# Patient Record
Sex: Female | Born: 1970
Health system: Southern US, Community
[De-identification: ages and names within clinical notes are randomized; demographics above are authoritative.]

## PROBLEM LIST (undated history)

## (undated) DIAGNOSIS — E669 Obesity, unspecified: Secondary | ICD-10-CM

## (undated) DIAGNOSIS — I10 Essential (primary) hypertension: Secondary | ICD-10-CM

## (undated) DIAGNOSIS — E782 Mixed hyperlipidemia: Secondary | ICD-10-CM

## (undated) DIAGNOSIS — K439 Ventral hernia without obstruction or gangrene: Secondary | ICD-10-CM

## (undated) DIAGNOSIS — J45909 Unspecified asthma, uncomplicated: Secondary | ICD-10-CM

## (undated) DIAGNOSIS — M1990 Unspecified osteoarthritis, unspecified site: Secondary | ICD-10-CM

## (undated) DIAGNOSIS — T7840XA Allergy, unspecified, initial encounter: Secondary | ICD-10-CM

## (undated) DIAGNOSIS — E119 Type 2 diabetes mellitus without complications: Secondary | ICD-10-CM

## (undated) DIAGNOSIS — J3089 Other allergic rhinitis: Secondary | ICD-10-CM

## (undated) HISTORY — PX: FOOT SURGERY: SHX648

## (undated) HISTORY — DX: Unspecified asthma, uncomplicated: J45.909

## (undated) HISTORY — DX: Allergy, unspecified, initial encounter: T78.40XA

## (undated) HISTORY — DX: Unspecified osteoarthritis, unspecified site: M19.90

## (undated) HISTORY — DX: Type 2 diabetes mellitus without complications: E11.9

## (undated) HISTORY — DX: Essential (primary) hypertension: I10

## (undated) HISTORY — PX: DILATION AND CURETTAGE OF UTERUS: SHX78

## (undated) HISTORY — DX: Other allergic rhinitis: J30.89

## (undated) HISTORY — PX: HERNIA REPAIR: SHX51

## (undated) HISTORY — PX: TUBAL LIGATION: SHX77

---

## 1991-07-07 HISTORY — PX: CHOLECYSTECTOMY: SHX55

## 2021-01-13 DIAGNOSIS — M1712 Unilateral primary osteoarthritis, left knee: Secondary | ICD-10-CM | POA: Diagnosis not present

## 2021-02-19 LAB — HM DIABETES EYE EXAM

## 2021-02-21 ENCOUNTER — Ambulatory Visit: Payer: Federal, State, Local not specified - PPO | Admitting: Podiatry

## 2021-02-21 ENCOUNTER — Ambulatory Visit (INDEPENDENT_AMBULATORY_CARE_PROVIDER_SITE_OTHER): Payer: Federal, State, Local not specified - PPO

## 2021-02-21 ENCOUNTER — Other Ambulatory Visit: Payer: Self-pay

## 2021-02-21 DIAGNOSIS — E0843 Diabetes mellitus due to underlying condition with diabetic autonomic (poly)neuropathy: Secondary | ICD-10-CM

## 2021-02-21 DIAGNOSIS — M79675 Pain in left toe(s): Secondary | ICD-10-CM

## 2021-02-21 DIAGNOSIS — M79674 Pain in right toe(s): Secondary | ICD-10-CM

## 2021-02-21 DIAGNOSIS — M722 Plantar fascial fibromatosis: Secondary | ICD-10-CM

## 2021-02-21 DIAGNOSIS — B351 Tinea unguium: Secondary | ICD-10-CM | POA: Diagnosis not present

## 2021-02-21 MED ORDER — BETAMETHASONE SOD PHOS & ACET 6 (3-3) MG/ML IJ SUSP
3.0000 mg | Freq: Once | INTRAMUSCULAR | Status: AC
  Administered 2021-02-21: 3 mg via INTRA_ARTICULAR

## 2021-02-21 NOTE — Progress Notes (Signed)
   SUBJECTIVE Patient with a history of diabetes mellitus presents to office today complaining of elongated, thickened nails that cause pain while ambulating in shoes.  Patient is unable to trim their own nails.  Patient also presents for symptomatic painful plantar fibromas to the bilateral feet.  She states that she has a longstanding history of plantar fibromas.  She actually had a plantar fibroma surgery to the left foot in 2014 in [place].  She has seen multiple physicians and discussed both conservative and surgical management for the lesions.  Today she is not interested in the surgery but she would like to address some conservative treatment options.  Patient is here for further evaluation and treatment.   No past medical history on file.  OBJECTIVE General Patient is awake, alert, and oriented x 3 and in no acute distress. Derm Skin is dry and supple bilateral. Negative open lesions or macerations. Remaining integument unremarkable. Nails are tender, long, thickened and dystrophic with subungual debris, consistent with onychomycosis, 1-5 bilateral. No signs of infection noted. Vasc  DP and PT pedal pulses palpable bilaterally. Temperature gradient within normal limits.  Neuro Epicritic and protective threshold sensation diminished bilaterally.  Musculoskeletal Exam No symptomatic pedal deformities noted bilateral. Muscular strength within normal limits. Symptomatic palpable plantar fibromas noted along the medial longitudinal arch of the bilateral feet.  ASSESSMENT 1. Diabetes Mellitus w/ peripheral neuropathy 2.  Pain due to onychomycosis of toenails bilateral 3. Plantar fibromas bilateral 4. H/o plantar fibroma excision LT foot 2014  PLAN OF CARE 1. Patient evaluated today. 2. Instructed to maintain good pedal hygiene and foot care. Stressed importance of controlling blood sugar.  3. Mechanical debridement of nails 1-5 bilaterally performed using a nail nipper. Filed with  dremel without incident.  4.  Injection of 0.5 cc Celestone Soluspan injected into the bilateral fibroma lesions  5.  Return to clinic in 3 mos.   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 Edrick Kins, DPM    [address], [place] [address]                Office [phone]  Fax [phone]

## 2021-03-18 ENCOUNTER — Ambulatory Visit (INDEPENDENT_AMBULATORY_CARE_PROVIDER_SITE_OTHER): Payer: Federal, State, Local not specified - PPO | Admitting: Internal Medicine

## 2021-03-18 ENCOUNTER — Other Ambulatory Visit: Payer: Self-pay

## 2021-03-18 ENCOUNTER — Encounter: Payer: Self-pay | Admitting: Internal Medicine

## 2021-03-18 VITALS — BP 136/88 | HR 65 | Ht 61.0 in | Wt 212.5 lb

## 2021-03-18 DIAGNOSIS — R0683 Snoring: Secondary | ICD-10-CM

## 2021-03-18 DIAGNOSIS — J4520 Mild intermittent asthma, uncomplicated: Secondary | ICD-10-CM | POA: Diagnosis not present

## 2021-03-18 DIAGNOSIS — E119 Type 2 diabetes mellitus without complications: Secondary | ICD-10-CM

## 2021-03-18 DIAGNOSIS — Z6841 Body Mass Index (BMI) 40.0 and over, adult: Secondary | ICD-10-CM | POA: Diagnosis not present

## 2021-03-18 DIAGNOSIS — E118 Type 2 diabetes mellitus with unspecified complications: Secondary | ICD-10-CM | POA: Insufficient documentation

## 2021-03-18 NOTE — Addendum Note (Signed)
Addended by: Lacretia Nicks L on: 03/18/2021 10:50 AM   Modules accepted: Orders

## 2021-03-18 NOTE — Assessment & Plan Note (Signed)

## 2021-03-18 NOTE — Assessment & Plan Note (Signed)

## 2021-03-18 NOTE — Assessment & Plan Note (Signed)
Asthma is stable on the present medication.  Patient was advised to lose weight.  She does not smoke but she has a history of smoking in the past.

## 2021-03-18 NOTE — Addendum Note (Signed)
Addended by: Cletis Athens on: 03/18/2021 10:34 AM   Modules accepted: Level of Service

## 2021-03-18 NOTE — Progress Notes (Addendum)
Established Patient Office Visit  Subjective:  Patient ID: Carla Gill, female    DOB: 02-Jun-1971  Age: 50 y.o. MRN: [id number]  CC:  Chief Complaint  Patient presents with   New Patient (Initial Visit)    New patient visit    HPI  Carla Gill presents for new pt visit  Past Medical History:  Diagnosis Date   Arthritis    Asthma    Diabetes ([place])    Environmental and seasonal allergies    Hypertension     Past Surgical History:  Procedure Laterality Date   [place]    Family History  Problem Relation Age of Onset   Diabetes Mother    Hypertension Mother    Heart disease Father    Diabetes Sister    Hypertension Sister    Diabetes Brother    Hypertension Brother    Heart disease Brother    Mental illness Brother    Depression Brother     Social History   Socioeconomic History   Marital status: Single    Spouse name: Not on file   Number of children: Not on file   Years of education: Not on file   Highest education level: Not on file  Occupational History   Not on file  Tobacco Use   Smoking status: Former    Years: 25.00    Types: Cigarettes   Smokeless tobacco: Never  Substance and Sexual Activity   Alcohol use: Never   Drug use: Never   Sexual activity: Not Currently  Other Topics Concern   Not on file  Social History Narrative   Not on file   Social Determinants of Health   Financial Resource Strain: Not on file  Food Insecurity: Not on file  Transportation Needs: Not on file  Physical Activity: Not on file  Stress: Not on file  Social Connections: Not on file  Intimate Partner Violence: Not on file     Current Outpatient Medications:    albuterol (VENTOLIN HFA) 108 (90 Base) MCG/ACT inhaler, Inhale into the lungs., Disp: , Rfl:    amLODipine (NORVASC) 5 MG tablet, Take 5 mg by mouth daily., Disp: , Rfl:    metFORMIN (GLUCOPHAGE) 500 MG tablet, Take 500 mg by mouth 2 (two) times  daily., Disp: , Rfl:    olmesartan (BENICAR) 40 MG tablet, Take 40 mg by mouth daily., Disp: , Rfl:    Allergies  Allergen Reactions   Penicillins     ROS Review of Systems  Constitutional: Negative.   HENT: Negative.    Eyes: Negative.   Respiratory: Negative.    Cardiovascular: Negative.   Gastrointestinal: Negative.   Endocrine: Negative.   Genitourinary: Negative.   Musculoskeletal: Negative.   Skin: Negative.   Allergic/Immunologic: Negative.   Neurological: Negative.   Hematological: Negative.   Psychiatric/Behavioral: Negative.    All other systems reviewed and are negative.    Objective:    Physical Exam Constitutional:      Appearance: She is obese. She is not ill-appearing.  HENT:     Head: Normocephalic and atraumatic.     Nose: Nose normal.     Mouth/Throat:     Mouth: Mucous membranes are moist.     Pharynx: Oropharynx is clear.  Eyes:     Extraocular Movements: Extraocular movements intact.     Conjunctiva/sclera: Conjunctivae normal.     Pupils: Pupils are equal, round, and reactive to light.  Cardiovascular:  Rate and Rhythm: Normal rate and regular rhythm.     Pulses: Normal pulses.     Heart sounds: No murmur heard.   No friction rub. No gallop.  Pulmonary:     Effort: Pulmonary effort is normal.  Abdominal:     General: Bowel sounds are normal. There is no distension.     Palpations: Abdomen is soft. There is no mass.     Tenderness: There is no abdominal tenderness. There is no right CVA tenderness, left CVA tenderness or guarding.     Hernia: No hernia is present.  Musculoskeletal:        General: Normal range of motion.     Cervical back: Normal range of motion.  Skin:    General: Skin is warm.  Neurological:     General: No focal deficit present.  Psychiatric:        Mood and Affect: Mood normal.    BP (!) 158/86   Pulse 65   Ht 5' 1"  (1.549 m)   Wt 212 lb 8 oz (96.4 kg)   BMI 40.15 kg/m  Wt Readings from Last 3  Encounters:  03/18/21 212 lb 8 oz (96.4 kg)     Health Maintenance Due  Topic Date Due   HEMOGLOBIN A1C  Never done   COVID-19 Vaccine (1) Never done   PNEUMOCOCCAL POLYSACCHARIDE VACCINE AGE 23-64 HIGH RISK  Never done   Pneumococcal Vaccine 9-98 Years old (1 - PCV) Never done   FOOT EXAM  Never done   OPHTHALMOLOGY EXAM  Never done   HIV Screening  Never done   Hepatitis C Screening  Never done   TETANUS/TDAP  Never done   PAP SMEAR-Modifier  Never done   COLONOSCOPY (Pts 45-24yr Insurance coverage will need to be confirmed)  Never done   MAMMOGRAM  Never done   Zoster Vaccines- Shingrix (1 of 2) Never done    There are no preventive care reminders to display for this patient.  No results found for: TSH No results found for: WBC, HGB, HCT, MCV, PLT No results found for: NA, K, CHLORIDE, CO2, GLUCOSE, BUN, CREATININE, BILITOT, ALKPHOS, AST, ALT, PROT, ALBUMIN, CALCIUM, ANIONGAP, EGFR, GFR No results found for: CHOL No results found for: HDL No results found for: LDLCALC No results found for: TRIG No results found for: CHOLHDL No results found for: HGBA1C    Assessment & Plan:   Problem List Items Addressed This Visit       Respiratory   Mild intermittent asthma - Primary    Asthma is stable on the present medication.  Patient was advised to lose weight.  She does not smoke but she has a history of smoking in the past.        Endocrine   Type 2 diabetes mellitus without complication, without long-term current use of insulin ([place]    - The patient's blood sugar is labile on med. - The patient will continue the current treatment regimen.  - I encouraged the patient to regularly check blood sugar.  - I encouraged the patient to monitor diet. I encouraged the patient to eat low-carb and low-sugar to help prevent blood sugar spikes.  - I encouraged the patient to continue following their prescribed treatment plan for diabetes - I informed the patient to get help if  blood sugar drops below 513mdL, or if suddenly have trouble thinking clearly or breathing.  Patient was advised to buy a book on diabetes from a local bookstore or from  [place].  Patient should read 2 chapters every day to keep the motivation going, this is in addition to some of the materials we provided them from the office.  There are other resources on the Internet like YouTube and wilkipedia to get an education on the diabetes        Other   Class 3 severe obesity due to excess calories without serious comorbidity with body mass index (BMI) of 40.0 to 44.9 in adult [facility])    - I encouraged the patient to lose weight.  - I educated them on making healthy dietary choices including eating more fruits and vegetables and less fried foods. - I encouraged the patient to exercise more, and educated on the benefits of exercise including weight loss, diabetes prevention, and hypertension prevention.   Dietary counseling with a registered dietician  Referral to a weight management support group (e.g. Weight Watchers, Overeaters Anonymous)  If your BMI is greater than 29 or you have gained more than 15 pounds you should work on weight loss.  Attend a healthy cooking class       Snoring    Patient test in the past is positive for sleep apnea I suggested that she should get her sleep study completed because she feels tired in the morning and has a history of snoring at home     Blood pressure 136/88, pulse 65, height 5' 1"  (1.549 m), weight 212 lb 8 oz (96.4 kg).  No orders of the defined types were placed in this encounter.   Follow-up: No follow-ups on file.    Cletis Athens, MD

## 2021-03-18 NOTE — Assessment & Plan Note (Signed)
Patient test in the past is positive for sleep apnea I suggested that she should get her sleep study completed because she feels tired in the morning and has a history of snoring at home

## 2021-03-19 LAB — CBC WITH DIFFERENTIAL/PLATELET
Absolute Monocytes: 395 cells/uL (ref 200–950)
Basophils Absolute: 20 cells/uL (ref 0–200)
Basophils Relative: 0.4 %
Eosinophils Absolute: 50 cells/uL (ref 15–500)
Eosinophils Relative: 1 %
HCT: 39.9 % (ref 35.0–45.0)
Hemoglobin: 13.1 g/dL (ref 11.7–15.5)
Lymphs Abs: 1990 cells/uL (ref 850–3900)
MCH: 30.3 pg (ref 27.0–33.0)
MCHC: 32.8 g/dL (ref 32.0–36.0)
MCV: 92.1 fL (ref 80.0–100.0)
MPV: 11.7 fL (ref 7.5–12.5)
Monocytes Relative: 7.9 %
Neutro Abs: 2545 cells/uL (ref 1500–7800)
Neutrophils Relative %: 50.9 %
Platelets: 204 10*3/uL (ref 140–400)
RBC: 4.33 10*6/uL (ref 3.80–5.10)
RDW: 13.2 % (ref 11.0–15.0)
Total Lymphocyte: 39.8 %
WBC: 5 10*3/uL (ref 3.8–10.8)

## 2021-03-19 LAB — COMPLETE METABOLIC PANEL WITH GFR
AG Ratio: 1.5 (calc) (ref 1.0–2.5)
ALT: 50 U/L — ABNORMAL HIGH (ref 6–29)
AST: 29 U/L (ref 10–35)
Albumin: 4.3 g/dL (ref 3.6–5.1)
Alkaline phosphatase (APISO): 86 U/L (ref 37–153)
BUN: 14 mg/dL (ref 7–25)
CO2: 25 mmol/L (ref 20–32)
Calcium: 9.3 mg/dL (ref 8.6–10.4)
Chloride: 103 mmol/L (ref 98–110)
Creat: 0.69 mg/dL (ref 0.50–1.03)
Globulin: 2.8 g/dL (calc) (ref 1.9–3.7)
Glucose, Bld: 137 mg/dL — ABNORMAL HIGH (ref 65–99)
Potassium: 4.3 mmol/L (ref 3.5–5.3)
Sodium: 138 mmol/L (ref 135–146)
Total Bilirubin: 0.4 mg/dL (ref 0.2–1.2)
Total Protein: 7.1 g/dL (ref 6.1–8.1)
eGFR: 105 mL/min/{1.73_m2} (ref >=60)

## 2021-03-19 LAB — HEMOGLOBIN A1C
Hgb A1c MFr Bld: 7.6 % of total Hgb — ABNORMAL HIGH (ref <5.7)
Mean Plasma Glucose: 171 mg/dL
eAG (mmol/L): 9.5 mmol/L

## 2021-04-07 ENCOUNTER — Ambulatory Visit (INDEPENDENT_AMBULATORY_CARE_PROVIDER_SITE_OTHER): Payer: Federal, State, Local not specified - PPO | Admitting: Internal Medicine

## 2021-04-07 ENCOUNTER — Other Ambulatory Visit: Payer: Self-pay

## 2021-04-07 VITALS — BP 127/70 | HR 70 | Resp 16 | Ht 61.0 in | Wt 213.0 lb

## 2021-04-07 DIAGNOSIS — I10 Essential (primary) hypertension: Secondary | ICD-10-CM | POA: Diagnosis not present

## 2021-04-07 DIAGNOSIS — G4733 Obstructive sleep apnea (adult) (pediatric): Secondary | ICD-10-CM

## 2021-04-07 DIAGNOSIS — J4520 Mild intermittent asthma, uncomplicated: Secondary | ICD-10-CM | POA: Diagnosis not present

## 2021-04-07 NOTE — Progress Notes (Signed)
Sleep Medicine   Office Visit  Patient Name: Carla Gill DOB: 1971/04/29 MRN [id number]    Chief Complaint: initial evaluation  Brief History:  Eular presents with a disruptive sleep history of 2 years.   Sleep quality is poor because she goes to sleep & still wakes tired. This is noted loud snoring everything nights. The patient's bed partner reports  observed apneas and snoring at night. The patient relates the following symptoms: 3rd shift, home by 4 am, average bedtime 8 - 830 am, but some times cannot go to sleep and stays awake over 24 hours are also present. The patient goes to sleep at 830am and wakes up at 5 pm.  she reports that her sleep quality is is not improving because she had home sleep test last year, but only scored 3 - 4 apneas and said he sleep provider there said she would need consult & test yearly.  Sleep quality is poor when outside home environment.  Patient has noted no restlessness of her legs at night.  The patient  relates no unusual behavior during the night.  The patient denies a history of psychiatric problems. The Epworth Sleepiness Score is 12 out of 24 .  The patient relates  Cardiovascular risk factors include: hypertension    ROS  General: (-) fever, (-) chills, (-) night sweat Nose and Sinuses: (-) nasal stuffiness or itchiness, (-) postnasal drip, (-) nosebleeds, (-) sinus trouble. Mouth and Throat: (-) sore throat, (-) hoarseness. Neck: (-) swollen glands, (-) enlarged thyroid, (-) neck pain. Respiratory: - cough, - shortness of breath, - wheezing. Neurologic: - numbness, - tingling. Psychiatric: - anxiety, - depression Sleep behavior: -sleep paralysis -hypnogogic hallucinations -dream enactment      -vivid dreams -cataplexy -night terrors -sleep walking   Current Medication: Outpatient Encounter Medications as of 04/07/2021  Medication Sig   albuterol (VENTOLIN HFA) 108 (90 Base) MCG/ACT inhaler Inhale into the lungs.   amLODipine  (NORVASC) 5 MG tablet Take 5 mg by mouth daily.   metFORMIN (GLUCOPHAGE) 500 MG tablet Take 500 mg by mouth 2 (two) times daily.   olmesartan (BENICAR) 40 MG tablet Take 40 mg by mouth daily.   No facility-administered encounter medications on file as of 04/07/2021.    Surgical History: Past Surgical History:  Procedure Laterality Date   CESAREAN SECTION  1990   CHOLECYSTECTOMY  1993    Medical History: Past Medical History:  Diagnosis Date   Arthritis    Asthma    Diabetes ([place])    Environmental and seasonal allergies    Hypertension     Family History: Non contributory to the present illness  Social History: Social History   Socioeconomic History   Marital status: Single    Spouse name: Not on file   Number of children: Not on file   Years of education: Not on file   Highest education level: Not on file  Occupational History   Not on file  Tobacco Use   Smoking status: Former    Years: 25.00    Types: Cigarettes   Smokeless tobacco: Never  Substance and Sexual Activity   Alcohol use: Never   Drug use: Never   Sexual activity: Not Currently  Other Topics Concern   Not on file  Social History Narrative   Not on file   Social Determinants of Health   Financial Resource Strain: Not on file  Food Insecurity: Not on file  Transportation Needs: Not on file  Physical Activity: Not on  file  Stress: Not on file  Social Connections: Not on file  Intimate Partner Violence: Not on file    Vital Signs: Blood pressure 127/70, pulse 70, resp. rate 16, height 5' 1"  (1.549 m), weight 213 lb (96.6 kg), SpO2 97 %.  Examination: General Appearance: The patient is well-developed, well-nourished, and in no distress. Neck Circumference: 43 cm Skin: Gross inspection of skin unremarkable. Head: normocephalic, no gross deformities. Eyes: no gross deformities noted. ENT: ears appear grossly normal Neurologic: Alert and oriented. No involuntary movements.    EPWORTH  SLEEPINESS SCALE:  Scale:  (0)= no chance of dozing; (1)= slight chance of dozing; (2)= moderate chance of dozing; (3)= high chance of dozing  Chance  Situtation    Sitting and reading: 3    Watching TV: 1    Sitting Inactive in public: 1    As a passenger in car: 3      Lying down to rest: 3    Sitting and talking: 0    Sitting quielty after lunch: 1    In a car, stopped in traffic: 0   TOTAL SCORE:   12 out of 24    SLEEP STUDIES:  No studies on file   LABS: Recent Results (from the past 2160 hour(s))  CBC with Differential/Platelet     Status: None   Collection Time: 03/18/21 10:51 AM  Result Value Ref Range   WBC 5.0 3.8 - 10.8 Thousand/uL   RBC 4.33 3.80 - 5.10 Million/uL   Hemoglobin 13.1 11.7 - 15.5 g/dL   HCT 39.9 35.0 - 45.0 %   MCV 92.1 80.0 - 100.0 fL   MCH 30.3 27.0 - 33.0 pg   MCHC 32.8 32.0 - 36.0 g/dL   RDW 13.2 11.0 - 15.0 %   Platelets 204 140 - 400 Thousand/uL   MPV 11.7 7.5 - 12.5 fL   Neutro Abs 2,545 1,500 - 7,800 cells/uL   Lymphs Abs 1,990 850 - 3,900 cells/uL   Absolute Monocytes 395 200 - 950 cells/uL   Eosinophils Absolute 50 15 - 500 cells/uL   Basophils Absolute 20 0 - 200 cells/uL   Neutrophils Relative % 50.9 %   Total Lymphocyte 39.8 %   Monocytes Relative 7.9 %   Eosinophils Relative 1.0 %   Basophils Relative 0.4 %  COMPLETE METABOLIC PANEL WITH GFR     Status: Abnormal   Collection Time: 03/18/21 10:51 AM  Result Value Ref Range   Glucose, Bld 137 (H) 65 - 99 mg/dL    Comment: .            Fasting reference interval . For someone without known diabetes, a glucose value >125 mg/dL indicates that they may have diabetes and this should be confirmed with a follow-up test. .    BUN 14 7 - 25 mg/dL   Creat 0.69 0.50 - 1.03 mg/dL   eGFR 105 > OR = 60 mL/min/1.23m    Comment: The eGFR is based on the CKD-EPI 2021 equation. To calculate  the new eGFR from a previous Creatinine or Cystatin C result, go to  https://www.kidney.org/professionals/ kdoqi/gfr%5Fcalculator    BUN/Creatinine Ratio NOT APPLICABLE 6 - 22 (calc)   Sodium 138 135 - 146 mmol/L   Potassium 4.3 3.5 - 5.3 mmol/L   Chloride 103 98 - 110 mmol/L   CO2 25 20 - 32 mmol/L   Calcium 9.3 8.6 - 10.4 mg/dL   Total Protein 7.1 6.1 - 8.1 g/dL   Albumin 4.3 3.6 -  5.1 g/dL   Globulin 2.8 1.9 - 3.7 g/dL (calc)   AG Ratio 1.5 1.0 - 2.5 (calc)   Total Bilirubin 0.4 0.2 - 1.2 mg/dL   Alkaline phosphatase (APISO) 86 37 - 153 U/L   AST 29 10 - 35 U/L   ALT 50 (H) 6 - 29 U/L  Hemoglobin A1c     Status: Abnormal   Collection Time: 03/18/21 10:51 AM  Result Value Ref Range   Hgb A1c MFr Bld 7.6 (H) <5.7 % of total Hgb    Comment: For someone without known diabetes, a hemoglobin A1c value of 6.5% or greater indicates that they may have  diabetes and this should be confirmed with a follow-up  test. . For someone with known diabetes, a value <7% indicates  that their diabetes is well controlled and a value  greater than or equal to 7% indicates suboptimal  control. A1c targets should be individualized based on  duration of diabetes, age, comorbid conditions, and  other considerations. . Currently, no consensus exists regarding use of hemoglobin A1c for diagnosis of diabetes for children. .    Mean Plasma Glucose 171 mg/dL   eAG (mmol/L) 9.5 mmol/L    Radiology: Patient was never admitted.  No results found.  No results found.    Assessment and Plan: Patient Active Problem List   Diagnosis Date Noted   OSA (obstructive sleep apnea) 04/07/2021   Hypertension 04/07/2021   Morbid obesity ([place]) 04/07/2021   Class 3 severe obesity due to excess calories without serious comorbidity with body mass index (BMI) of 40.0 to 44.9 in adult [facility]) 03/18/2021   Type 2 diabetes mellitus without complication, without long-term current use of insulin ([place]) 03/18/2021   Mild intermittent asthma 03/18/2021   Snoring 03/18/2021   1. OSA  (obstructive sleep apnea) PLAN OSA:   Patient evaluation suggests high risk of sleep disordered breathing due to observed apneas, snoring and history of abnormal sleep studies (home studies) in past.  Patient has comorbid cardiovascular risk factors including: hypertension which could be exacerbated by pathologic sleep-disordered breathing.  Suggest: PSG (in lab, not a home study, as she has had two borderline home studies in past)  to assess the patient's sleep disordered breathing. The patient was also counselled on weight loss to optimize sleep health.   2. Mild intermittent asthma, unspecified whether complicated Stable with use of albuterol prn, continue.   3. Hypertension, unspecified type Hypertension Counseling:   The following hypertensive lifestyle modification were recommended and discussed:  1. Limiting alcohol intake to less than 1 oz/day of ethanol:(24 oz of beer or 8 oz of wine or 2 oz of 100-proof whiskey). 2. Take baby ASA 81 mg daily. 3. Importance of regular aerobic exercise and losing weight. 4. Reduce dietary saturated fat and cholesterol intake for overall cardiovascular health. 5. Maintaining adequate dietary potassium, calcium, and magnesium intake. 6. Regular monitoring of the blood pressure. 7. Reduce sodium intake to less than 100 mmol/day (less than 2.3 gm of sodium or less than 6 gm of sodium choride)    4. Morbid obesity ([place]) Obesity Counseling: Had a lengthy discussion regarding patients BMI and weight issues. Patient was instructed on portion control as well as increased activity. Also discussed caloric restrictions with trying to maintain intake less than 2000 Kcal. Discussions were made in accordance with the 5As of weight management. Simple actions such as not eating late and if able to, taking a walk is suggested.    General Counseling: I  have discussed the findings of the evaluation and examination with Najae.  I have also discussed any further  diagnostic evaluation thatmay be needed or ordered today. Enid verbalizes understanding of the findings of todays visit. We also reviewed her medications today and discussed drug interactions and side effects including but not limited excessive drowsiness and altered mental states. We also discussed that there is always a risk not just to her but also people around her. she has been encouraged to call the office with any questions or concerns that should arise related to todays visit.  No orders of the defined types were placed in this encounter.       I have personally obtained a history, evaluated the patient, evaluated pertinent data, formulated the assessment and plan and placed orders.    This patient was seen today by Tressie Ellis, PA-C in collaboration with Dr. Devona Konig.   Allyne Gee, MD [facility] Diplomate ABMS Pulmonary and Critical Care Medicine Sleep medicine

## 2021-04-08 ENCOUNTER — Encounter: Payer: Self-pay | Admitting: Internal Medicine

## 2021-04-08 ENCOUNTER — Other Ambulatory Visit: Payer: Self-pay

## 2021-04-08 ENCOUNTER — Ambulatory Visit: Payer: Federal, State, Local not specified - PPO | Admitting: Internal Medicine

## 2021-04-08 VITALS — BP 145/83 | HR 64 | Ht 61.0 in | Wt 216.9 lb

## 2021-04-08 DIAGNOSIS — G4733 Obstructive sleep apnea (adult) (pediatric): Secondary | ICD-10-CM

## 2021-04-08 DIAGNOSIS — Z6841 Body Mass Index (BMI) 40.0 and over, adult: Secondary | ICD-10-CM

## 2021-04-08 DIAGNOSIS — I10 Essential (primary) hypertension: Secondary | ICD-10-CM | POA: Diagnosis not present

## 2021-04-08 DIAGNOSIS — J4520 Mild intermittent asthma, uncomplicated: Secondary | ICD-10-CM | POA: Diagnosis not present

## 2021-04-08 DIAGNOSIS — E119 Type 2 diabetes mellitus without complications: Secondary | ICD-10-CM

## 2021-04-08 DIAGNOSIS — K439 Ventral hernia without obstruction or gangrene: Secondary | ICD-10-CM

## 2021-04-08 DIAGNOSIS — Z1211 Encounter for screening for malignant neoplasm of colon: Secondary | ICD-10-CM

## 2021-04-08 NOTE — Assessment & Plan Note (Signed)

## 2021-04-08 NOTE — Assessment & Plan Note (Signed)

## 2021-04-08 NOTE — Assessment & Plan Note (Signed)
Pulmonary stable at the present time, she will be referred to GI

## 2021-04-08 NOTE — Assessment & Plan Note (Signed)
Patient is being scheduled for a sleep study

## 2021-04-08 NOTE — Addendum Note (Signed)
Addended by: Alois Cliche on: 04/08/2021 10:52 AM   Modules accepted: Orders

## 2021-04-08 NOTE — Progress Notes (Signed)
Established Patient Office Visit  Subjective:  Patient ID: Carla Gill, female    DOB: 1970-08-23  Age: 50 y.o. MRN: [id number]  CC:  Chief Complaint  Patient presents with   Follow-up    GI Problem The primary symptoms include fatigue and diarrhea. Primary symptoms do not include fever, weight loss, abdominal pain, vomiting, melena, jaundice, hematochezia, dysuria, myalgias, arthralgias or rash.  The illness is also significant for bloating. The illness does not include anorexia. Significant associated medical issues include irritable bowel syndrome. Associated medical issues do not include bowel resection.   Carla Gill presents for check up  Past Medical History:  Diagnosis Date   Arthritis    Asthma    Diabetes ([place])    Environmental and seasonal allergies    Hypertension     Past Surgical History:  Procedure Laterality Date   [place]    Family History  Problem Relation Age of Onset   Diabetes Mother    Hypertension Mother    Heart disease Father    Diabetes Sister    Hypertension Sister    Diabetes Brother    Hypertension Brother    Heart disease Brother    Mental illness Brother    Depression Brother     Social History   Socioeconomic History   Marital status: Single    Spouse name: Not on file   Number of children: Not on file   Years of education: Not on file   Highest education level: Not on file  Occupational History   Not on file  Tobacco Use   Smoking status: Former    Years: 25.00    Types: Cigarettes   Smokeless tobacco: Never  Substance and Sexual Activity   Alcohol use: Never   Drug use: Never   Sexual activity: Not Currently  Other Topics Concern   Not on file  Social History Narrative   Not on file   Social Determinants of Health   Financial Resource Strain: Not on file  Food Insecurity: Not on file  Transportation Needs: Not on file  Physical Activity: Not on file  Stress:  Not on file  Social Connections: Not on file  Intimate Partner Violence: Not on file     Current Outpatient Medications:    albuterol (VENTOLIN HFA) 108 (90 Base) MCG/ACT inhaler, Inhale into the lungs., Disp: , Rfl:    amLODipine (NORVASC) 5 MG tablet, Take 5 mg by mouth daily., Disp: , Rfl:    metFORMIN (GLUCOPHAGE) 500 MG tablet, Take 500 mg by mouth 2 (two) times daily., Disp: , Rfl:    olmesartan (BENICAR) 40 MG tablet, Take 40 mg by mouth daily., Disp: , Rfl:    Allergies  Allergen Reactions   Penicillins     ROS Review of Systems  Constitutional:  Positive for fatigue. Negative for fever and weight loss.  HENT: Negative.    Eyes: Negative.   Respiratory: Negative.    Cardiovascular: Negative.   Gastrointestinal:  Positive for bloating and diarrhea. Negative for abdominal pain, anorexia, hematochezia, jaundice, melena and vomiting.  Endocrine: Negative.   Genitourinary: Negative.  Negative for dysuria.  Musculoskeletal: Negative.  Negative for arthralgias and myalgias.  Skin: Negative.  Negative for rash.  Allergic/Immunologic: Negative.   Neurological: Negative.   Hematological: Negative.   Psychiatric/Behavioral: Negative.    All other systems reviewed and are negative.    Objective:    Physical Exam Vitals reviewed.  Constitutional:  Appearance: Normal appearance.  HENT:     Mouth/Throat:     Mouth: Mucous membranes are moist.  Eyes:     Pupils: Pupils are equal, round, and reactive to light.  Neck:     Vascular: No carotid bruit.  Cardiovascular:     Rate and Rhythm: Normal rate and regular rhythm.     Pulses: Normal pulses.     Heart sounds: Normal heart sounds.  Pulmonary:     Effort: Pulmonary effort is normal.     Breath sounds: Normal breath sounds.  Abdominal:     General: Bowel sounds are normal.     Palpations: Abdomen is soft. There is no hepatomegaly, splenomegaly or mass.     Tenderness: There is no abdominal tenderness.     Hernia:  No hernia is present.  Musculoskeletal:        General: No tenderness.     Cervical back: Neck supple.     Right lower leg: No edema.     Left lower leg: No edema.  Skin:    Findings: No rash.  Neurological:     Mental Status: She is alert and oriented to person, place, and time.     Motor: No weakness.  Psychiatric:        Mood and Affect: Mood and affect normal.        Behavior: Behavior normal.    BP (!) 145/83   Pulse 64   Ht 5' 1"  (1.549 m)   Wt 216 lb 14.4 oz (98.4 kg)   BMI 40.98 kg/m  Wt Readings from Last 3 Encounters:  04/08/21 216 lb 14.4 oz (98.4 kg)  04/07/21 213 lb (96.6 kg)  03/18/21 212 lb 8 oz (96.4 kg)     Health Maintenance Due  Topic Date Due   COVID-19 Vaccine (1) Never done   FOOT EXAM  Never done   OPHTHALMOLOGY EXAM  Never done   HIV Screening  Never done   Hepatitis C Screening  Never done   TETANUS/TDAP  Never done   PAP SMEAR-Modifier  Never done   COLONOSCOPY (Pts 45-63yr Insurance coverage will need to be confirmed)  Never done   MAMMOGRAM  Never done   Zoster Vaccines- Shingrix (1 of 2) Never done    There are no preventive care reminders to display for this patient.  No results found for: TSH Lab Results  Component Value Date   WBC 5.0 03/18/2021   HGB 13.1 03/18/2021   HCT 39.9 03/18/2021   MCV 92.1 03/18/2021   PLT 204 03/18/2021   Lab Results  Component Value Date   NA 138 03/18/2021   K 4.3 03/18/2021   CO2 25 03/18/2021   GLUCOSE 137 (H) 03/18/2021   BUN 14 03/18/2021   CREATININE 0.69 03/18/2021   BILITOT 0.4 03/18/2021   AST 29 03/18/2021   ALT 50 (H) 03/18/2021   PROT 7.1 03/18/2021   CALCIUM 9.3 03/18/2021   EGFR 105 03/18/2021   No results found for: CHOL No results found for: HDL No results found for: LDLCALC No results found for: TRIG No results found for: CHOLHDL Lab Results  Component Value Date   HGBA1C 7.6 (H) 03/18/2021      Assessment & Plan:   Problem List Items Addressed This Visit        Cardiovascular and Mediastinum   Hypertension - Primary     Patient denies any chest pain or shortness of breath there is no history of palpitation or paroxysmal  nocturnal dyspnea   patient was advised to follow low-salt low-cholesterol diet    ideally I want to keep systolic blood pressure below 130 mmHg, patient was asked to check blood pressure one times a week and give me a report on that.  Patient will be follow-up in 3 months  or earlier as needed, patient will call me back for any change in the cardiovascular symptoms Patient was advised to buy a book from local bookstore concerning blood pressure and read several chapters  every day.  This will be supplemented by some of the material we will give him from the office.  Patient should also utilize other resources like YouTube and Internet to learn more about the blood pressure and the diet.        Respiratory   Mild intermittent asthma   OSA (obstructive sleep apnea)    Patient is being scheduled for a sleep study        Endocrine   Type 2 diabetes mellitus without complication, without long-term current use of insulin ([place])    - The patient's blood sugar is labile on med. - The patient will continue the current treatment regimen.  - I encouraged the patient to regularly check blood sugar.  - I encouraged the patient to monitor diet. I encouraged the patient to eat low-carb and low-sugar to help prevent blood sugar spikes.  - I encouraged the patient to continue following their prescribed treatment plan for diabetes - I informed the patient to get help if blood sugar drops below 65m/dL, or if suddenly have trouble thinking clearly or breathing.  Patient was advised to buy a book on diabetes from a local bookstore or from [place]  Patient should read 2 chapters every day to keep the motivation going, this is in addition to some of the materials we provided them from the office.  There are other resources on the Internet like  YouTube and wilkipedia to get an education on the diabetes        Other   Class 3 severe obesity due to excess calories without serious comorbidity with body mass index (BMI) of 40.0 to 44.9 in adult ([facility]    - I encouraged the patient to lose weight.  - I educated them on making healthy dietary choices including eating more fruits and vegetables and less fried foods. - I encouraged the patient to exercise more, and educated on the benefits of exercise including weight loss, diabetes prevention, and hypertension prevention.   Dietary counseling with a registered dietician  Referral to a weight management support group (e.g. Weight Watchers, Overeaters Anonymous)  If your BMI is greater than 29 or you have gained more than 15 pounds you should work on weight loss.  Attend a healthy cooking class       Ventral hernia without obstruction or gangrene    Pulmonary stable at the present time, she will be referred to GI       No orders of the defined types were placed in this encounter.   Follow-up: No follow-ups on file.    JCletis Athens MD

## 2021-04-08 NOTE — Assessment & Plan Note (Signed)

## 2021-04-23 ENCOUNTER — Other Ambulatory Visit: Payer: Self-pay

## 2021-05-14 ENCOUNTER — Ambulatory Visit: Payer: Federal, State, Local not specified - PPO | Admitting: Internal Medicine

## 2021-05-14 ENCOUNTER — Encounter: Payer: Self-pay | Admitting: Internal Medicine

## 2021-05-14 ENCOUNTER — Other Ambulatory Visit: Payer: Self-pay

## 2021-05-14 VITALS — BP 120/80 | HR 67 | Ht 61.0 in | Wt 213.0 lb

## 2021-05-14 DIAGNOSIS — I10 Essential (primary) hypertension: Secondary | ICD-10-CM | POA: Diagnosis not present

## 2021-05-14 DIAGNOSIS — J4520 Mild intermittent asthma, uncomplicated: Secondary | ICD-10-CM

## 2021-05-14 DIAGNOSIS — R0683 Snoring: Secondary | ICD-10-CM

## 2021-05-14 DIAGNOSIS — K439 Ventral hernia without obstruction or gangrene: Secondary | ICD-10-CM

## 2021-05-14 DIAGNOSIS — Z6841 Body Mass Index (BMI) 40.0 and over, adult: Secondary | ICD-10-CM

## 2021-05-14 DIAGNOSIS — G4733 Obstructive sleep apnea (adult) (pediatric): Secondary | ICD-10-CM | POA: Diagnosis not present

## 2021-05-14 DIAGNOSIS — E119 Type 2 diabetes mellitus without complications: Secondary | ICD-10-CM | POA: Diagnosis not present

## 2021-05-14 DIAGNOSIS — E66813 Obesity, class 3: Secondary | ICD-10-CM

## 2021-05-14 MED ORDER — ALBUTEROL SULFATE HFA 108 (90 BASE) MCG/ACT IN AERS: 1.0000 | INHALATION_SPRAY | Freq: Four times a day (QID) | RESPIRATORY_TRACT | 2 refills | Status: DC | PRN

## 2021-05-14 MED ORDER — OLMESARTAN MEDOXOMIL 40 MG PO TABS: 40.0000 mg | ORAL_TABLET | Freq: Every day | ORAL | 1 refills | Status: DC

## 2021-05-14 MED ORDER — METFORMIN HCL 500 MG PO TABS: 500.0000 mg | ORAL_TABLET | Freq: Two times a day (BID) | ORAL | 1 refills | Status: DC

## 2021-05-14 NOTE — Assessment & Plan Note (Signed)

## 2021-05-14 NOTE — Assessment & Plan Note (Signed)
Patient was advised to use rescue inhaler as needed

## 2021-05-14 NOTE — Assessment & Plan Note (Signed)
Patient was advised to lose weight 

## 2021-05-14 NOTE — Assessment & Plan Note (Signed)

## 2021-05-14 NOTE — Progress Notes (Signed)
Established Patient Office Visit  Subjective:  Patient ID: Carla Gill, female    DOB: [birth year]  Age: 50 y.o. MRN: [id number]  CC:  Chief Complaint  Patient presents with   Discuss FMLA paperwork    Patient is here to discuss FLMA     HPI  Carla Gill presents for fill up FMLA  paper  Past Medical History:  Diagnosis Date   Arthritis    Asthma    Diabetes ([place])    Environmental and seasonal allergies    Hypertension     Past Surgical History:  Procedure Laterality Date   [place]    Family History  Problem Relation Age of Onset   Diabetes Mother    Hypertension Mother    Heart disease Father    Diabetes Sister    Hypertension Sister    Diabetes Brother    Hypertension Brother    Heart disease Brother    Mental illness Brother    Depression Brother     Social History   Socioeconomic History   Marital status: Single    Spouse name: Not on file   Number of children: Not on file   Years of education: Not on file   Highest education level: Not on file  Occupational History   Not on file  Tobacco Use   Smoking status: Former    Years: 25.00    Types: Cigarettes   Smokeless tobacco: Never  Substance and Sexual Activity   Alcohol use: Never   Drug use: Never   Sexual activity: Not Currently  Other Topics Concern   Not on file  Social History Narrative   Not on file   Social Determinants of Health   Financial Resource Strain: Not on file  Food Insecurity: Not on file  Transportation Needs: Not on file  Physical Activity: Not on file  Stress: Not on file  Social Connections: Not on file  Intimate Partner Violence: Not on file     Current Outpatient Medications:    albuterol (VENTOLIN HFA) 108 (90 Base) MCG/ACT inhaler, Inhale 1 puff into the lungs every 6 (six) hours as needed for wheezing or shortness of breath., Disp: 18 g, Rfl: 2   amLODipine (NORVASC) 5 MG tablet, Take 5 mg by mouth daily.,  Disp: , Rfl:    metFORMIN (GLUCOPHAGE) 500 MG tablet, Take 1 tablet (500 mg total) by mouth 2 (two) times daily., Disp: 180 tablet, Rfl: 1   olmesartan (BENICAR) 40 MG tablet, Take 1 tablet (40 mg total) by mouth daily., Disp: 90 tablet, Rfl: 1   Allergies  Allergen Reactions   Penicillins     ROS Review of Systems  Constitutional: Negative.   HENT: Negative.    Eyes: Negative.   Respiratory: Negative.    Cardiovascular: Negative.   Gastrointestinal: Negative.   Endocrine: Negative.   Genitourinary: Negative.   Musculoskeletal: Negative.   Skin: Negative.   Allergic/Immunologic: Negative.   Neurological: Negative.   Hematological: Negative.   Psychiatric/Behavioral: Negative.    All other systems reviewed and are negative.    Objective:    Physical Exam Vitals reviewed.  Constitutional:      Appearance: Normal appearance.  HENT:     Mouth/Throat:     Mouth: Mucous membranes are moist.  Eyes:     Pupils: Pupils are equal, round, and reactive to light.  Neck:     Vascular: No carotid bruit.  Cardiovascular:  Rate and Rhythm: Normal rate and regular rhythm.     Pulses: Normal pulses.     Heart sounds: Normal heart sounds.  Pulmonary:     Effort: Pulmonary effort is normal.     Breath sounds: Normal breath sounds.  Abdominal:     General: Bowel sounds are normal.     Palpations: Abdomen is soft. There is no hepatomegaly, splenomegaly or mass.     Tenderness: There is no abdominal tenderness.     Hernia: No hernia is present.  Musculoskeletal:        General: No tenderness.     Cervical back: Neck supple.     Right lower leg: No edema.     Left lower leg: No edema.  Skin:    Findings: No rash.  Neurological:     Mental Status: She is alert and oriented to person, place, and time.     Motor: No weakness.  Psychiatric:        Mood and Affect: Mood and affect normal.        Behavior: Behavior normal.    BP 120/80   Pulse 67   Ht _0  (1.549 m)   Wt  213 lb (96.6 kg)   BMI 40.25 kg/m  Wt Readings from Last 3 Encounters:  05/14/21 213 lb (96.6 kg)  04/08/21 216 lb 14.4 oz (98.4 kg)  04/07/21 213 lb (96.6 kg)     Health Maintenance Due  Topic Date Due   COVID-19 Vaccine (1) Never done   Pneumococcal Vaccine 44-79 Years old (1 - PCV) Never done   FOOT EXAM  Never done   OPHTHALMOLOGY EXAM  Never done   HIV Screening  Never done   Hepatitis C Screening  Never done   TETANUS/TDAP  Never done   PAP SMEAR-Modifier  Never done   COLONOSCOPY (Pts 45-70yr Insurance coverage will need to be confirmed)  Never done   MAMMOGRAM  Never done   Zoster Vaccines- Shingrix (1 of 2) Never done    There are no preventive care reminders to display for this patient.  No results found for: TSH Lab Results  Component Value Date   WBC 5.0 03/18/2021   HGB 13.1 03/18/2021   HCT 39.9 03/18/2021   MCV 92.1 03/18/2021   PLT 204 03/18/2021   Lab Results  Component Value Date   NA 138 03/18/2021   K 4.3 03/18/2021   CO2 25 03/18/2021   GLUCOSE 137 (H) 03/18/2021   BUN 14 03/18/2021   CREATININE 0.69 03/18/2021   BILITOT 0.4 03/18/2021   AST 29 03/18/2021   ALT 50 (H) 03/18/2021   PROT 7.1 03/18/2021   CALCIUM 9.3 03/18/2021   EGFR 105 03/18/2021   No results found for: CHOL No results found for: HDL No results found for: LDLCALC No results found for: TRIG No results found for: CHOLHDL Lab Results  Component Value Date   HGBA1C 7.6 (H) 03/18/2021      Assessment & Plan:   Problem List Items Addressed This Visit       Cardiovascular and Mediastinum   Hypertension - Primary     Patient denies any chest pain or shortness of breath there is no history of palpitation or paroxysmal nocturnal dyspnea   patient was advised to follow low-salt low-cholesterol diet    ideally I want to keep systolic blood pressure below 130 mmHg, patient was asked to check blood pressure one times a week and give me a report on that.  Patient will be  follow-up in 3 months  or earlier as needed, patient will call me back for any change in the cardiovascular symptoms Patient was advised to buy a book from local bookstore concerning blood pressure and read several chapters  every day.  This will be supplemented by some of the material we will give him from the office.  Patient should also utilize other resources like YouTube and Internet to learn more about the blood pressure and the diet.      Relevant Medications   olmesartan (BENICAR) 40 MG tablet     Respiratory   Mild intermittent asthma    Patient was advised to use rescue inhaler as needed      Relevant Medications   albuterol (VENTOLIN HFA) 108 (90 Base) MCG/ACT inhaler   OSA (obstructive sleep apnea)    Patient was advised to lose weight.        Endocrine   Type 2 diabetes mellitus without complication, without long-term current use of insulin ([place])    - The patient's blood sugar is labile on med. - The patient will continue the current treatment regimen.  - I encouraged the patient to regularly check blood sugar.  - I encouraged the patient to monitor diet. I encouraged the patient to eat low-carb and low-sugar to help prevent blood sugar spikes.  - I encouraged the patient to continue following their prescribed treatment plan for diabetes - I informed the patient to get help if blood sugar drops below 15m/dL, or if suddenly have trouble thinking clearly or breathing.  Patient was advised to buy a book on diabetes from a local bookstore or from [place]  Patient should read 2 chapters every day to keep the motivation going, this is in addition to some of the materials we provided them from the office.  There are other resources on the Internet like YouTube and wilkipedia to get an education on the diabetes      Relevant Medications   metFORMIN (GLUCOPHAGE) 500 MG tablet   olmesartan (BENICAR) 40 MG tablet     Other   Class 3 severe obesity due to excess calories without  serious comorbidity with body mass index (BMI) of 40.0 to 44.9 in adult ([facility]    Behavioral modification strategies: increasing lean protein intake, decreasing simple carbohydrates, increasing vegetables, increasing water intake, decreasing eating out, no skipping meals, meal planning and cooking strategies, keeping healthy foods in the home and planning for success.      Relevant Medications   metFORMIN (GLUCOPHAGE) 500 MG tablet   Snoring   Ventral hernia without obstruction or gangrene    Stable at the present time       Meds ordered this encounter  Medications   albuterol (VENTOLIN HFA) 108 (90 Base) MCG/ACT inhaler    Sig: Inhale 1 puff into the lungs every 6 (six) hours as needed for wheezing or shortness of breath.    Dispense:  18 g    Refill:  2   metFORMIN (GLUCOPHAGE) 500 MG tablet    Sig: Take 1 tablet (500 mg total) by mouth 2 (two) times daily.    Dispense:  180 tablet    Refill:  1   olmesartan (BENICAR) 40 MG tablet    Sig: Take 1 tablet (40 mg total) by mouth daily.    Dispense:  90 tablet    Refill:  1    Follow-up: No follow-ups on file.    JCletis Athens MD

## 2021-05-14 NOTE — Assessment & Plan Note (Signed)
Behavioral modification strategies: increasing lean protein intake, decreasing simple carbohydrates, increasing vegetables, increasing water intake, decreasing eating out, no skipping meals, meal planning and cooking strategies, keeping healthy foods in the home and planning for success. 

## 2021-05-14 NOTE — Assessment & Plan Note (Signed)
Stable at the present time. 

## 2021-05-19 ENCOUNTER — Other Ambulatory Visit: Payer: Self-pay

## 2021-05-19 ENCOUNTER — Encounter: Payer: Self-pay | Admitting: Gastroenterology

## 2021-05-19 ENCOUNTER — Ambulatory Visit: Payer: Federal, State, Local not specified - PPO | Admitting: Gastroenterology

## 2021-05-19 VITALS — BP 137/80 | HR 67 | Temp 98.7°F | Ht 61.0 in | Wt 211.0 lb

## 2021-05-19 DIAGNOSIS — Z1211 Encounter for screening for malignant neoplasm of colon: Secondary | ICD-10-CM

## 2021-05-19 DIAGNOSIS — K458 Other specified abdominal hernia without obstruction or gangrene: Secondary | ICD-10-CM | POA: Diagnosis not present

## 2021-05-19 MED ORDER — SUTAB 1479-225-188 MG PO TABS: ORAL_TABLET | ORAL | 0 refills | Status: DC

## 2021-05-19 NOTE — Progress Notes (Signed)
Jonathon Bellows MD, MRCP(U.K) [address]  [place]  [place], [place] [address]  Main: [phone]  Fax: [phone]   Gastroenterology Consultation  Referring Provider:     Cletis Athens, MD Primary Care Physician:  Carla Athens, MD Primary Gastroenterologist:  Dr. Jonathon Bellows  Reason for Consultation:     GI issues and screening colonoscopy        HPI:   Carla Gill is a 51 y.o. y/o female referred for consultation & management  by Dr. Cletis Athens, MD.    She says that she had a colonoscopy in [place] a few years back and was told to have a repeat she states that she had a few polyps taken out cannot recollect anything more than that.  Denies any family history of colon cancer or polyps.  Denies any NSAID use.  Denies any rectal bleeding.  The past few months she has noticed that she had 4-5 bowel movements per day.  Not watery but soft in nature.  Denies any use of any artificial sugars or sweeteners in her diet.  She has been on metformin for some years.  No unintentional weight loss.  She feels a bulging sensation in her abdomen which she wanted me to examine.  She has had surgeries on her abdomen for GYN issues as well as for cholecystectomy. Past Medical History:  Diagnosis Date   Arthritis    Asthma    Diabetes ([place])    Environmental and seasonal allergies    Hypertension     Past Surgical History:  Procedure Laterality Date   [place]    Prior to Admission medications   Medication Sig Start Date End Date Taking? Authorizing Provider  albuterol (VENTOLIN HFA) 108 (90 Base) MCG/ACT inhaler Inhale 1 puff into the lungs every 6 (six) hours as needed for wheezing or shortness of breath. 05/14/21   Carla Athens, MD  amLODipine (NORVASC) 5 MG tablet Take 5 mg by mouth daily. 12/06/20   [provider]  metFORMIN (GLUCOPHAGE) 500 MG tablet Take 1 tablet (500 mg total) by mouth 2 (two) times daily. 05/14/21   Carla Athens, MD  olmesartan (BENICAR) 40 MG tablet Take 1 tablet (40 mg total) by mouth daily. 05/14/21   Carla Athens, MD    Family History  Problem Relation Age of Onset   Diabetes Mother    Hypertension Mother    Heart disease Father    Diabetes Sister    Hypertension Sister    Diabetes Brother    Hypertension Brother    Heart disease Brother    Mental illness Brother    Depression Brother      Social History   Tobacco Use   Smoking status: Former    Years: 25.00    Types: Cigarettes   Smokeless tobacco: Never  Substance Use Topics   Alcohol use: Never   Drug use: Never    Allergies as of 05/19/2021 - Review Complete 05/14/2021  Allergen Reaction Noted   Penicillins  03/18/2021    Review of Systems:    All systems reviewed and negative except where noted in HPI.   Physical Exam:  There were no vitals taken for this visit. No LMP recorded. Psych:  Alert and cooperative. Normal mood and affect. General:   Alert,  Well-developed, well-nourished, pleasant and cooperative in NAD Head:  Normocephalic and atraumatic. Abdomen:  Normal bowel sounds.  No bruits.  Soft, non-tender and  non-distended without masses, hepatosplenomegaly or hernias noted.  No guarding or rebound tenderness.    Neurologic:  Alert and oriented x3;  grossly normal neurologically. Psych:  Alert and cooperative. Normal mood and affect.  Imaging Studies: No results found.  Assessment and Plan:   Carla Gill is a 50 y.o. y/o female has been referred for colon cancer screening but has noticed a change in her bowel habits for the past few months.  Change in bowel habits differentials include status postcholecystectomy and softer stools related to the same and occasionally metformin can cause loose stools.  Olmesartan has been associated with microscopic colitis.  Plan 1.  Refer to Dr. Hampton Abbot to evaluate bulging in the anterior abdominal wall with occasional abdominal pain.  Questionable hernia.  2.   Screening colonoscopy but I would also take biopsies during the same to rule out microscopic colitis.  If negative will suggest high-fiber diet or a trial of Questran.  I have discussed alternative options, risks & benefits,  which include, but are not limited to, bleeding, infection, perforation,respiratory complication & drug reaction.  The patient agrees with this plan & written consent will be obtained.    Follow up in as needed  Dr Jonathon Bellows MD,MRCP(U.K)

## 2021-05-23 ENCOUNTER — Ambulatory Visit: Payer: Federal, State, Local not specified - PPO | Admitting: Podiatry

## 2021-05-27 ENCOUNTER — Other Ambulatory Visit: Payer: Self-pay

## 2021-06-04 ENCOUNTER — Telehealth: Payer: Self-pay | Admitting: Surgery

## 2021-06-04 ENCOUNTER — Ambulatory Visit: Payer: Federal, State, Local not specified - PPO | Admitting: Surgery

## 2021-06-04 ENCOUNTER — Other Ambulatory Visit: Payer: Self-pay

## 2021-06-04 ENCOUNTER — Encounter: Payer: Self-pay | Admitting: Surgery

## 2021-06-04 VITALS — BP 116/77 | HR 70 | Temp 98.1°F | Ht 61.0 in | Wt 211.8 lb

## 2021-06-04 DIAGNOSIS — K432 Incisional hernia without obstruction or gangrene: Secondary | ICD-10-CM

## 2021-06-04 NOTE — Patient Instructions (Addendum)
Our surgery scheduler will call you within 24-48 hours to schedule surgery. Please have the Blue surgery sheet available when speaking with them.    Hernia, Adult   A hernia happens when an organ or tissue inside your body pushes out through a weak spot in the muscles of your belly (abdomen). This makes a bulge. The bulge may be: In a scar from a surgery that was done in your belly (incisional hernia). Near your belly button (umbilical hernia). In your groin (inguinal hernia). Your groin is the area where your leg meets your lower belly. If you are a female, this type could also be in your scrotum. In your upper thigh (femoral hernia). Inside your belly (hiatal hernia). This happens when your stomach slides above the muscle between your belly and your chest (diaphragm). What are the causes? This condition may be caused by: Lifting heavy things. Coughing over a long period of time. Having trouble pooping (constipation). Trouble pooping can lead to straining. A cut from surgery in your belly. A physical problem that is present at birth. Being very overweight. Smoking. Too much fluid in your belly. A testicle that has not moved down into the scrotum, in males. What are the signs or symptoms? The main symptom is a bulge in the area of the hernia, but a bulge may not always be seen. It may grow bigger or be easier to see when you cough or strain (such as when lifting something heavy). A hernia that can be pushed back into the belly rarely causes pain. A hernia that cannot be pushed back into the belly may lose its blood supply. This may cause: Pain. Fever. A feeling like you may vomit, and vomiting. Swelling. Trouble pooping. How is this treated? A hernia that is small and painless may not need to be treated. A hernia that is large or painful may be treated with surgery. Surgery to treat a hernia involves pushing the bulge back into place and repairing the weak area of the muscle or  belly. Follow these instructions at home: Activity Avoid straining the muscles near your hernia. This can happen when you: Lift something heavy. Poop (have a bowel movement). Do not lift anything that is heavier than 10 lb (4.5 kg), or the limit that you are told. When you lift something heavy, use your leg muscles. Do not use your back muscles to lift. Prevent trouble pooping If told by your doctor, take steps to prevent trouble pooping. You may need to: Drink enough fluid to keep your pee (urine) pale yellow. Take medicines. You will be told what medicines to take. Eat foods that are high in fiber. These include beans, whole grains, and fresh fruits and vegetables. Limit foods that are high in fat and sugar. These include fried or sweet foods. General instructions When you cough, try to cough gently. You may try to push your hernia back in by gently pressing on it when you are lying down. Do not try to force the bulge back in if it will not go in easily. If you are overweight, work with your doctor to lose weight safely. Do not smoke or use any products that contain nicotine or tobacco. If you need help quitting, ask your doctor. If you will be having surgery, watch your hernia for changes in shape, size, or color. Tell your doctor if you see any changes. Take over-the-counter and prescription medicines only as told by your doctor. Keep all follow-up visits. Contact a doctor if: You  get new pain, swelling, or redness near your hernia. You poop fewer times in a week than normal. You have trouble pooping. You have poop that is more dry than normal. You have poop that is harder or larger than normal. Get help right away if: You have a fever or chills. You have belly pain that gets worse. You feel like you may vomit, or you vomit. Your hernia cannot be pushed in by gently pressing on it when you are lying down. Your hernia: Changes in shape or size. Changes color. Feels hard, or it  hurts when you touch it. These symptoms may be an emergency. Get help right away. Call your local emergency services (911 in the [place] Do not wait to see if the symptoms will go away. Do not drive yourself to the hospital. Summary A hernia happens when an organ or tissue inside your body pushes out through a weak spot in the belly muscles. This creates a bulge. If your hernia is small and it does not hurt, you may not need treatment. If your hernia is large or it hurts, you may need surgery. If you will be having surgery, watch your hernia for changes in shape, size, or color. Tell your doctor about any changes. This information is not intended to replace advice given to you by your health care provider. Make sure you discuss any questions you have with your health care provider. Document Revised: 01/29/2020 Document Reviewed: 01/29/2020 Elsevier Patient Education  [place].

## 2021-06-04 NOTE — Telephone Encounter (Signed)
Outgoing call is made, unable to leave a message, mailbox full.  Please inform patient of the following:   Pre-Admission date/time, COVID Testing date and Surgery date.  Surgery Date: 07/17/21 Preadmission Testing Date: 07/09/21 (phone 8:00 am -1:00 pm) Covid Testing Date: Not needed.    Also patient will need to call at [phone], between 1-3:00pm the day before surgery, to find out what time to arrive for surgery.

## 2021-06-04 NOTE — Telephone Encounter (Signed)
Patient returns call, she is not informed of all dates regarding her surgery and verbalized understanding.

## 2021-06-05 ENCOUNTER — Encounter: Payer: Self-pay | Admitting: Surgery

## 2021-06-05 DIAGNOSIS — Z124 Encounter for screening for malignant neoplasm of cervix: Secondary | ICD-10-CM | POA: Diagnosis not present

## 2021-06-05 LAB — HM PAP SMEAR

## 2021-06-05 NOTE — Progress Notes (Addendum)
06/04/2021  Reason for Visit:  Abdominal wall hernia  Requesting Provider:  Jonathon Bellows, MD  History of Present Illness: Carla Gill is a 50 y.o. female presenting for evaluation of an abdominal wall hernia.  The patient reports that she's been having a bulging sensation and discomfort in the mid abdomen.  She's had this for a while already and reports that it gets aggravated with her work.  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  She denies any nausea, vomiting, constipation or diarrhea.  She recently saw Dr. Vicente Males for initial visit for colonoscopy and she's currently schedule for colonoscopy on 07/14/21.    Of note, the patient has had a laparoscopic cholecystectomy, laparoscopic tubal ligation, and c-section.  Past Medical History: Past Medical History:  Diagnosis Date   Arthritis    Asthma    Diabetes ([place])    Environmental and seasonal allergies    Hypertension      Past Surgical History: Past Surgical History:  Procedure Laterality Date   [place]   FOOT SURGERY     TUBAL LIGATION      Home Medications: Prior to Admission medications   Medication Sig Start Date End Date Taking? Authorizing Provider  albuterol (VENTOLIN HFA) 108 (90 Base) MCG/ACT inhaler Inhale 1 puff into the lungs every 6 (six) hours as needed for wheezing or shortness of breath. 05/14/21  Yes Masoud, Viann Shove, MD  amLODipine (NORVASC) 5 MG tablet Take 5 mg by mouth daily. 12/06/20  Yes [provider]  metFORMIN (GLUCOPHAGE) 500 MG tablet Take 1 tablet (500 mg total) by mouth 2 (two) times daily. 05/14/21  Yes Masoud, Viann Shove, MD  olmesartan (BENICAR) 40 MG tablet Take 1 tablet (40 mg total) by mouth daily. 05/14/21  Yes Cletis Athens, MD  Sodium Sulfate-Mag Sulfate-KCl (SUTAB) [phone] MG TABS At 5 PM take 12 tablets using the 8 oz cup provided in the kit drinking 5 cups of water and 5 hours  before your procedure repeat the same process. 05/19/21  Yes Jonathon Bellows, MD    Allergies: Allergies  Allergen Reactions   Penicillins     Social History:  reports that she has quit smoking. Her smoking use included cigarettes. She has never used smokeless tobacco. She reports that she does not drink alcohol and does not use drugs.   Family History: Family History  Problem Relation Age of Onset   Diabetes Mother    Hypertension Mother    Heart disease Father    Diabetes Sister    Hypertension Sister    Diabetes Brother    Hypertension Brother    Heart disease Brother    Mental illness Brother    Depression Brother     Review of Systems: Review of Systems  Constitutional:  Negative for chills and fever.  HENT:  Negative for hearing loss.   Respiratory:  Negative for shortness of breath.   Cardiovascular:  Negative for chest pain.  Gastrointestinal:  Positive for abdominal pain. Negative for nausea and vomiting.  Genitourinary:  Negative for dysuria.  Musculoskeletal:  Negative for myalgias.  Skin:  Negative for rash.  Neurological:  Negative for dizziness.  Psychiatric/Behavioral:  Negative for depression.    Physical Exam BP 116/77   Pulse 70   Temp 98.1 F (36.7 C) (Oral)   Ht 5' 1"  (1.549 m)   Wt 211 lb 12.8 oz (96.1 kg)   SpO2  98%   BMI 40.02 kg/m  CONSTITUTIONAL: No acute distress, well nourished. HEENT:  Normocephalic, atraumatic, extraocular motion intact. NECK: Trachea is midline, and there is no jugular venous distension.  RESPIRATORY:  Lungs are clear, and breath sounds are equal bilaterally. Normal respiratory effort without pathologic use of accessory muscles. CARDIOVASCULAR: Heart is regular without murmurs, gallops, or rubs. GI: The abdomen is soft, non-distended, with some discomfort to palpation at the supraumbilical region, at the site of an umbilical port site from her cholecystectomy. In this location, there is a small bulge which has some  tenderness to palpation.  Unable to move it well or reduce it, and cannot feel the exact edges of a hernia defect.   MUSCULOSKELETAL:  Normal muscle strength and tone in all four extremities.  No peripheral edema or cyanosis. SKIN: Skin turgor is normal. There are no pathologic skin lesions.  NEUROLOGIC:  Motor and sensation is grossly normal.  Cranial nerves are grossly intact. PSYCH:  Alert and oriented to person, place and time. Affect is normal.  Laboratory Analysis: Labs from 03/18/21: Na 138, K 4.3, Cl 103, CO2 25, BUN 14, Cr 0.69.  Total bili 0.4, AST 29, ALT 50, Alk Phos 86.  WBC 5, Hgb 13.1, Hct 39.9, Plt 204.  HgA1c 7.6  Imaging: No results found.  Assessment and Plan: This is a 50 y.o. female with likely an incarcerated incisional hernia  --Discussed with the patient that she likely has an incarcerated incisional hernia, at the site of a prior umbilical port for her cholecystectomy.  The area is tender to deep palpation or manipulation and I am unable to reduce it.  Discussed with her the categories of hernias from reducible to incarcerated to strangulated.  Reviewed with her the options for conservative vs surgical management and the natural progression of hernias.  Given her symptoms, I would recommend surgical repair, and she is in agreement. --Discussed with her the plan for a robotic incisional hernia repair.  Reviewed with her the surgery at length, including risks of bleeding, infection, injury to surrounding structures, post-op recovery and activity restrictions, and she's willing to proceed. --With the holidays coming up, she will be very busy this coming month and would like to schedule surgery after her colonoscopy.   I think for now that's reasonable as her symptoms are not progressing.  Stressed with her the recommendation of avoiding heavy lifting as much as possible, and also the possible use of an abdominal binder. --Will schedule her for 07/17/21.  She will need to follow  up with me later next month for H&P update.  Will send for medical clearance.  I spent 80 minutes dedicated to the care of this patient on the date of this encounter to include pre-visit review of records, face-to-face time with the patient discussing diagnosis and management, and any post-visit coordination of care.   Melvyn Neth, [facility]

## 2021-06-06 ENCOUNTER — Telehealth: Payer: Self-pay

## 2021-06-06 ENCOUNTER — Encounter: Payer: Self-pay | Admitting: Podiatry

## 2021-06-06 ENCOUNTER — Other Ambulatory Visit: Payer: Self-pay

## 2021-06-06 ENCOUNTER — Ambulatory Visit: Payer: Federal, State, Local not specified - PPO | Admitting: Podiatry

## 2021-06-06 DIAGNOSIS — M79674 Pain in right toe(s): Secondary | ICD-10-CM

## 2021-06-06 DIAGNOSIS — B351 Tinea unguium: Secondary | ICD-10-CM | POA: Diagnosis not present

## 2021-06-06 DIAGNOSIS — M722 Plantar fascial fibromatosis: Secondary | ICD-10-CM | POA: Diagnosis not present

## 2021-06-06 DIAGNOSIS — M79675 Pain in left toe(s): Secondary | ICD-10-CM | POA: Diagnosis not present

## 2021-06-06 MED ORDER — BETAMETHASONE SOD PHOS & ACET 6 (3-3) MG/ML IJ SUSP
3.0000 mg | Freq: Once | INTRAMUSCULAR | Status: AC
Start: 2021-06-06 — End: 2021-06-06
  Administered 2021-06-06: 3 mg via INTRA_ARTICULAR

## 2021-06-06 NOTE — Progress Notes (Signed)
   SUBJECTIVE Patient with a history of diabetes mellitus presents to office today complaining of elongated, thickened nails that cause pain while ambulating in shoes especially to the bilateral great toes.  Patient is unable to trim their own nails.  Patient also presents for symptomatic painful plantar fibromas to the bilateral feet.  She states that she has a longstanding history of plantar fibromas.  She actually had a plantar fibroma surgery to the left foot in 2014 in [place].  She has seen multiple physicians and discussed both conservative and surgical management for the lesions.  Last visit on 02/21/2021 we administered steroid injections to the bilateral feet and she tolerated this well and she says it helped alleviate her symptoms significantly.  She does work for [employer] on concrete floors and currently working 12-hour shifts.  She understands that this is the main primary cause of the patient's foot pain.  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 Derm Skin is dry and supple bilateral. Negative open lesions or macerations. Remaining integument unremarkable. Nails are tender, long, thickened and dystrophic with subungual debris, consistent with onychomycosis, 1-5 bilateral. No signs of infection noted. Vasc  DP and PT pedal pulses palpable bilaterally. Temperature gradient within normal limits.  Neuro Epicritic and protective threshold sensation diminished bilaterally.  Musculoskeletal Exam No symptomatic pedal deformities noted bilateral. Muscular strength within normal limits. Symptomatic palpable plantar fibromas noted along the medial longitudinal arch of the bilateral feet.  ASSESSMENT 1. Diabetes Mellitus w/ peripheral  neuropathy 2.  Pain due to onychomycosis of toenails bilateral 3. Plantar fibromas bilateral/plantar fasciitis bilateral 4. H/o plantar fibroma excision LT foot 2014  PLAN OF CARE 1. Patient evaluated today. 2. Instructed to maintain good pedal hygiene and foot care. Stressed importance of controlling blood sugar.  3. Mechanical debridement of nails of the bilateral great toes performed using a nail nipper. Filed with dremel without incident.  4.  Injection of 0.5 cc Celestone Soluspan injected into the bilateral fibroma lesions along the plantar fascia 5.  Return to clinic in 3 mos.   *Works for [employer] on [employer] shifts.  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  Has 5 more years before she can retire    Edrick Kins, DPM [facility]  Dr. Edrick Kins, DPM    [address], [place] [address]                Office [phone]  Fax [phone]

## 2021-06-06 NOTE — Telephone Encounter (Signed)
Medical Clearance faxed to Dr.Masoud at this time.  Spoke with patient to let her know she may want to call Dr.Masoud's office to see if an office visit is needed prior to clearance being completed.

## 2021-06-09 DIAGNOSIS — Z1231 Encounter for screening mammogram for malignant neoplasm of breast: Secondary | ICD-10-CM | POA: Diagnosis not present

## 2021-06-19 ENCOUNTER — Emergency Department
Admission: EM | Admit: 2021-06-19 | Discharge: 2021-06-20 | Disposition: A | Discharge status: Home / Self Care | Payer: Federal, State, Local not specified - PPO | Attending: Emergency Medicine | Admitting: Emergency Medicine

## 2021-06-19 ENCOUNTER — Encounter: Payer: Self-pay | Admitting: Emergency Medicine

## 2021-06-19 ENCOUNTER — Emergency Department: Payer: Federal, State, Local not specified - PPO

## 2021-06-19 ENCOUNTER — Other Ambulatory Visit: Payer: Self-pay

## 2021-06-19 DIAGNOSIS — X58XXXA Exposure to other specified factors, initial encounter: Secondary | ICD-10-CM | POA: Diagnosis not present

## 2021-06-19 DIAGNOSIS — F17210 Nicotine dependence, cigarettes, uncomplicated: Secondary | ICD-10-CM | POA: Insufficient documentation

## 2021-06-19 DIAGNOSIS — E119 Type 2 diabetes mellitus without complications: Secondary | ICD-10-CM | POA: Insufficient documentation

## 2021-06-19 DIAGNOSIS — Z7984 Long term (current) use of oral hypoglycemic drugs: Secondary | ICD-10-CM | POA: Insufficient documentation

## 2021-06-19 DIAGNOSIS — S60222A Contusion of left hand, initial encounter: Secondary | ICD-10-CM | POA: Diagnosis not present

## 2021-06-19 DIAGNOSIS — J4520 Mild intermittent asthma, uncomplicated: Secondary | ICD-10-CM | POA: Diagnosis not present

## 2021-06-19 DIAGNOSIS — Y990 Civilian activity done for income or pay: Secondary | ICD-10-CM | POA: Diagnosis not present

## 2021-06-19 DIAGNOSIS — S6992XA Unspecified injury of left wrist, hand and finger(s), initial encounter: Secondary | ICD-10-CM | POA: Diagnosis not present

## 2021-06-19 DIAGNOSIS — I10 Essential (primary) hypertension: Secondary | ICD-10-CM | POA: Diagnosis not present

## 2021-06-19 DIAGNOSIS — Z79899 Other long term (current) drug therapy: Secondary | ICD-10-CM | POA: Diagnosis not present

## 2021-06-19 DIAGNOSIS — M7989 Other specified soft tissue disorders: Secondary | ICD-10-CM | POA: Diagnosis not present

## 2021-06-19 MED ORDER — NAPROXEN 500 MG PO TABS
500.0000 mg | ORAL_TABLET | Freq: Once | ORAL | Status: AC
  Administered 2021-06-19: 500 mg via ORAL
  Filled 2021-06-19: qty 1

## 2021-06-19 NOTE — ED Triage Notes (Signed)
Patient ambulatory to triage with steady gait, without difficulty or distress noted; pt st "my left hand was locking up on me at work"

## 2021-06-19 NOTE — ED Provider Notes (Signed)
[facility] Emergency Department Provider Note  ____________________________________________  Time seen: Approximately 11:35 PM  I have reviewed the triage vital signs and the nursing notes.   HISTORY  Chief Complaint Wrist Pain   HPI Carla Gill is a 50 y.o. female with a history of arthritis, diabetes, hypertension who presents for evaluation of left hand and wrist pain.  Patient reports that her pain started this evening while she was at work.  She works for the [employer].  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  She noticed swelling on the palm of her hand just below her thumb and is complaining of pain that is radiating into the wrist.  The pain is worse with movement of the wrist and the thumb.  There is no redness, there is no warmth, no prior history of trauma or surgery to the wrist or gout.  No fever or chills.  She denies any trauma  Past Medical History:  Diagnosis Date   Arthritis    Asthma    Diabetes ([place])    Environmental and seasonal allergies    Hypertension     Patient Active Problem List   Diagnosis Date Noted   Ventral hernia without obstruction or gangrene 04/08/2021   OSA (obstructive sleep apnea) 04/07/2021   Hypertension 04/07/2021   Morbid obesity ([place]) 04/07/2021   Class 3 severe obesity due to excess calories without serious comorbidity with body mass index (BMI) of 40.0 to 44.9 in adult [facility]) 03/18/2021   Type 2 diabetes mellitus without complication, without long-term current use of insulin ([place]) 03/18/2021   Mild intermittent asthma 03/18/2021   Snoring 03/18/2021    Past Surgical History:  Procedure Laterality Date   [place]      Prior to Admission medications   Medication Sig Start Date End Date Taking? Authorizing Provider  naproxen (NAPROSYN) 500 MG tablet Take 1 tablet (500 mg  total) by mouth 2 (two) times daily with a meal for 3 days. 06/20/21 06/23/21 Yes Macaiah Mangal, [place], MD  albuterol (VENTOLIN HFA) 108 (90 Base) MCG/ACT inhaler Inhale 1 puff into the lungs every 6 (six) hours as needed for wheezing or shortness of breath. 05/14/21   Cletis Athens, MD  amLODipine (NORVASC) 5 MG tablet Take 5 mg by mouth daily. 12/06/20   [provider]  metFORMIN (GLUCOPHAGE) 500 MG tablet Take 1 tablet (500 mg total) by mouth 2 (two) times daily. 05/14/21   Cletis Athens, MD  olmesartan (BENICAR) 40 MG tablet Take 1 tablet (40 mg total) by mouth daily. 05/14/21   Cletis Athens, MD  Sodium Sulfate-Mag Sulfate-KCl (SUTAB) [phone] MG TABS At 5 PM take 12 tablets using the 8 oz cup provided in the kit drinking 5 cups of water and 5 hours before your procedure repeat the same process. 05/19/21   Jonathon Bellows, MD    Allergies Penicillins  Family History  Problem Relation Age of Onset   Diabetes Mother    Hypertension Mother    Heart disease Father    Diabetes Sister    Hypertension Sister    Diabetes Brother    Hypertension Brother    Heart disease Brother    Mental illness Brother    Depression Brother     Social History Social History   Tobacco Use   Smoking status: Some Days  Years: 25.00    Types: Cigarettes   Smokeless tobacco: Never  Vaping Use   Vaping Use: Never used  Substance Use Topics   Alcohol use: Never   Drug use: Never    Review of Systems  Constitutional: Negative for fever. Eyes: Negative for visual changes. ENT: Negative for sore throat. Neck: No neck pain  Cardiovascular: Negative for chest pain. Respiratory: Negative for shortness of breath. Gastrointestinal: Negative for abdominal pain, vomiting or diarrhea. Genitourinary: Negative for dysuria. Musculoskeletal: Negative for back pain. + L hand and wrist pain Skin: Negative for rash. Neurological: Negative for headaches, weakness or numbness. Psych: No SI or  HI  ____________________________________________   PHYSICAL EXAM:  VITAL SIGNS: ED Triage Vitals [06/19/21 2258]  Enc Vitals Group     BP (!) 152/102     Pulse Rate 87     Resp 20     Temp 97.6 F (36.4 C)     Temp Source Oral     SpO2 100 %     Weight 210 lb (95.3 kg)     Height 5' 1"  (1.549 m)     Head Circumference      Peak Flow      Pain Score 10     Pain Loc      Pain Edu?      Excl. in [place]?     Constitutional: Alert and oriented. Well appearing and in no apparent distress. HEENT:      Head: Normocephalic and atraumatic.         Eyes: Conjunctivae are normal. Sclera is non-icteric.       Mouth/Throat: Mucous membranes are moist.       Neck: Supple with no signs of meningismus. Cardiovascular: Regular rate and rhythm.  Respiratory: Normal respiratory effort.  Musculoskeletal:  There is swelling and tenderness to palpation on the palmar as pect of the L thenar eminence with no erythema or warmth Neurologic: Normal speech and language. Face is symmetric. Moving all extremities. No gross focal neurologic deficits are appreciated. Skin: Skin is warm, dry and intact. No rash noted. Psychiatric: Mood and affect are normal. Speech and behavior are normal.  ____________________________________________   LABS (all labs ordered are listed, but only abnormal results are displayed)  Labs Reviewed - No data to display ____________________________________________  EKG  none  ____________________________________________  RADIOLOGY  I have personally reviewed the images performed during this visit and I agree with the Radiologist's read.   Interpretation by Radiologist:  DG Wrist Complete Left  Result Date: 06/19/2021 CLINICAL DATA:  Pain, swelling left hand and wrist EXAM: LEFT WRIST - COMPLETE 3+ VIEW COMPARISON:  Hand series today FINDINGS: Degenerative changes of the 1st carpometacarpal joint. No acute bony abnormality. Specifically, no fracture, subluxation, or  dislocation. Soft tissues unremarkable. IMPRESSION: No acute bony abnormality. Electronically Signed   By: Rolm Baptise M.D.   On: 06/19/2021 23:38   DG Hand Complete Left  Result Date: 06/19/2021 CLINICAL DATA:  Pain, swelling left hand and wrist EXAM: LEFT HAND - COMPLETE 3+ VIEW COMPARISON:  Left wrist series today FINDINGS: Degenerative changes at the 1st carpometacarpal joint. Remainder the joint spaces are maintained. No acute bony abnormality. Specifically, no fracture, subluxation, or dislocation. Soft tissues are unremarkable. IMPRESSION: No acute bony abnormality. Electronically Signed   By: Rolm Baptise M.D.   On: 06/19/2021 23:38     ____________________________________________   PROCEDURES  Procedure(s) performed: None Procedures   Critical Care performed:  None ____________________________________________  INITIAL IMPRESSION / ASSESSMENT AND PLAN / ED COURSE  50 y.o. female with a history of arthritis, diabetes, hypertension who presents for evaluation of left hand and wrist pain.  Pain started today at work due to overuse. No trauma patient does have swelling and tenderness to palpation of the thenar eminence of the left hand in the palmar aspect with no erythema or warmth. Full ROM of the wrist.  X-ray with no signs of fracture or dislocation.  Either bruising or swelling from overuse/arthritis.  Review of old medical record shows the patient has pretty well-controlled diabetes and normal kidney function.  Therefore we will put her on a short course of naproxen for anti-inflammatory.  Recommended rest for 48 hours and icing.  Close follow-up with primary care doctor.  Return to the emergency room for fever, redness or warmth of the joint.      _____________________________________________ Please note:  Patient was evaluated in Emergency Department today for the symptoms described in the history of present illness. Patient was evaluated in the context of the global  COVID-19 pandemic, which necessitated consideration that the patient might be at risk for infection with the SARS-CoV-2 virus that causes COVID-19. Institutional protocols and algorithms that pertain to the evaluation of patients at risk for COVID-19 are in a state of rapid change based on information released by regulatory bodies including the CDC and federal and state organizations. These policies and algorithms were followed during the patient's care in the ED.  Some ED evaluations and interventions may be delayed as a result of limited staffing during the pandemic.   [place] Controlled Substance Database was reviewed by me. ____________________________________________   FINAL CLINICAL IMPRESSION(S) / ED DIAGNOSES   Final diagnoses:  Contusion of left hand, initial encounter      NEW MEDICATIONS STARTED DURING THIS VISIT:  ED Discharge Orders          Ordered    naproxen (NAPROSYN) 500 MG tablet  2 times daily with meals        06/20/21 0000             Note:  This document was prepared using Dragon voice recognition software and may include unintentional dictation errors.    Alfred Levins, [place], MD 06/20/21 0001

## 2021-06-20 MED ORDER — NAPROXEN 500 MG PO TABS: 500.0000 mg | ORAL_TABLET | Freq: Two times a day (BID) | ORAL | 0 refills | Status: AC

## 2021-06-22 DIAGNOSIS — G4730 Sleep apnea, unspecified: Secondary | ICD-10-CM | POA: Diagnosis not present

## 2021-06-23 ENCOUNTER — Other Ambulatory Visit: Payer: Self-pay

## 2021-06-23 ENCOUNTER — Ambulatory Visit: Payer: Federal, State, Local not specified - PPO | Admitting: Surgery

## 2021-06-23 ENCOUNTER — Encounter: Payer: Self-pay | Admitting: Surgery

## 2021-06-23 VITALS — BP 126/80 | HR 74 | Temp 98.6°F | Ht 61.0 in | Wt 213.8 lb

## 2021-06-23 DIAGNOSIS — K432 Incisional hernia without obstruction or gangrene: Secondary | ICD-10-CM | POA: Diagnosis not present

## 2021-06-23 NOTE — Progress Notes (Signed)
BP 121/72    Pulse 69    Temp 98.5 F (36.9 C) (Oral)    Ht 5' 2.6" (1.59 m)    Wt 211 lb 9.6 oz (96 kg)    LMP 05/19/2021 (Approximate)    SpO2 96%    BMI 37.96 kg/m    Subjective:    Patient ID: Carla Gill, female    DOB: 11-26-1970, 50 y.o.   MRN: [id number]  HPI: Carla Gill is a 50 y.o. female  Chief Complaint  Patient presents with   Establish Care    Pt states she recently went to the ER for issues with her L hand. States it is throbbing at the base of her palm   Diabetes   Hypertension   Patient presents to clinic to establish care with new PCP.  Introduced to Designer, jewellery role and practice setting.  All questions answered.  Discussed provider/patient relationship and expectations.  Patient reports a history of hypertension, diabetes, hernia, sleep apnea and asthma.  She is a former smoker she smoked for about 25 years about 1/2 pack per week.   Patient denies a history of: Elevated Cholesterol, Thyroid problems, Depression, Anxiety, Neurological problems, and Abdominal problems.   She had her Mammogram done on 06/09/21.  [facility] pap [facility].  Patient states she goes for her Colonoscopy on 07/14/2020.    Active Ambulatory Problems    Diagnosis Date Noted   Class 3 severe obesity due to excess calories without serious comorbidity with body mass index (BMI) of 40.0 to 44.9 in adult [facility]) 03/18/2021   Type 2 diabetes mellitus without complication, without long-term current use of insulin ([place]) 03/18/2021   Mild intermittent asthma 03/18/2021   Snoring 03/18/2021   OSA (obstructive sleep apnea) 04/07/2021   Hypertension 04/07/2021   Morbid obesity ([place]) 04/07/2021   Ventral hernia without obstruction or gangrene 04/08/2021   Resolved Ambulatory Problems    Diagnosis Date Noted   No Resolved Ambulatory Problems   Past Medical History:  Diagnosis Date   Arthritis    Asthma    Diabetes ([place])    Environmental and  seasonal allergies    Past Surgical History:  Procedure Laterality Date   CESAREAN SECTION  1990   CHOLECYSTECTOMY  1993   FOOT SURGERY     TUBAL LIGATION     Family History  Problem Relation Age of Onset   Diabetes Mother    Hypertension Mother    Heart disease Father    Diabetes Sister    Hypertension Sister    Hypertension Sister    Hypertension Sister    Hypertension Sister    Diabetes Brother    Hypertension Brother    Heart disease Brother    Mental illness Brother    Depression Brother    Diabetes Brother    Hypertension Brother    Hypertension Brother    Asthma Son    Diabetes Maternal Grandmother    Hypertension Paternal Grandmother    Hypertension Paternal Grandfather     Review of Systems  Eyes:  Negative for visual disturbance.  Respiratory:  Negative for chest tightness and shortness of breath.   Cardiovascular:  Negative for chest pain, palpitations and leg swelling.  Endocrine: Negative for polydipsia and polyuria.  Neurological:  Negative for dizziness, light-headedness, numbness and headaches.   Per HPI unless specifically indicated above     Objective:    BP 121/72    Pulse 69  Temp 98.5 F (36.9 C) (Oral)    Ht 5' 2.6" (1.59 m)    Wt 211 lb 9.6 oz (96 kg)    LMP 05/19/2021 (Approximate)    SpO2 96%    BMI 37.96 kg/m   Wt Readings from Last 3 Encounters:  06/24/21 211 lb 9.6 oz (96 kg)  06/23/21 213 lb 12.8 oz (97 kg)  06/19/21 210 lb (95.3 kg)    Physical Exam Vitals and nursing note reviewed.  Constitutional:      General: She is not in acute distress.    Appearance: Normal appearance. She is obese. She is not ill-appearing, toxic-appearing or diaphoretic.  HENT:     Head: Normocephalic.     Right Ear: External ear normal.     Left Ear: External ear normal.     Nose: Nose normal.     Mouth/Throat:     Mouth: Mucous membranes are moist.     Pharynx: Oropharynx is clear.  Eyes:     General:        Right eye: No discharge.         Left eye: No discharge.     Extraocular Movements: Extraocular movements intact.     Conjunctiva/sclera: Conjunctivae normal.     Pupils: Pupils are equal, round, and reactive to light.  Cardiovascular:     Rate and Rhythm: Normal rate and regular rhythm.     Heart sounds: No murmur heard. Pulmonary:     Effort: Pulmonary effort is normal. No respiratory distress.     Breath sounds: Normal breath sounds. No wheezing or rales.  Musculoskeletal:     Cervical back: Normal range of motion and neck supple.  Skin:    General: Skin is warm and dry.     Capillary Refill: Capillary refill takes less than 2 seconds.  Neurological:     General: No focal deficit present.     Mental Status: She is alert and oriented to person, place, and time. Mental status is at baseline.  Psychiatric:        Mood and Affect: Mood normal.        Behavior: Behavior normal.        Thought Content: Thought content normal.        Judgment: Judgment normal.    Results for orders placed or performed in visit on 03/18/21  CBC with Differential/Platelet  Result Value Ref Range   WBC 5.0 3.8 - 10.8 Thousand/uL   RBC 4.33 3.80 - 5.10 Million/uL   Hemoglobin 13.1 11.7 - 15.5 g/dL   HCT 39.9 35.0 - 45.0 %   MCV 92.1 80.0 - 100.0 fL   MCH 30.3 27.0 - 33.0 pg   MCHC 32.8 32.0 - 36.0 g/dL   RDW 13.2 11.0 - 15.0 %   Platelets 204 140 - 400 Thousand/uL   MPV 11.7 7.5 - 12.5 fL   Neutro Abs 2,545 1,500 - 7,800 cells/uL   Lymphs Abs 1,990 850 - 3,900 cells/uL   Absolute Monocytes 395 200 - 950 cells/uL   Eosinophils Absolute 50 15 - 500 cells/uL   Basophils Absolute 20 0 - 200 cells/uL   Neutrophils Relative % 50.9 %   Total Lymphocyte 39.8 %   Monocytes Relative 7.9 %   Eosinophils Relative 1.0 %   Basophils Relative 0.4 %  COMPLETE METABOLIC PANEL WITH GFR  Result Value Ref Range   Glucose, Bld 137 (H) 65 - 99 mg/dL   BUN 14 7 - 25 mg/dL  Creat 0.69 0.50 - 1.03 mg/dL   eGFR 105 > OR = 60 mL/min/1.11m    BUN/Creatinine Ratio NOT APPLICABLE 6 - 22 (calc)   Sodium 138 135 - 146 mmol/L   Potassium 4.3 3.5 - 5.3 mmol/L   Chloride 103 98 - 110 mmol/L   CO2 25 20 - 32 mmol/L   Calcium 9.3 8.6 - 10.4 mg/dL   Total Protein 7.1 6.1 - 8.1 g/dL   Albumin 4.3 3.6 - 5.1 g/dL   Globulin 2.8 1.9 - 3.7 g/dL (calc)   AG Ratio 1.5 1.0 - 2.5 (calc)   Total Bilirubin 0.4 0.2 - 1.2 mg/dL   Alkaline phosphatase (APISO) 86 37 - 153 U/L   AST 29 10 - 35 U/L   ALT 50 (H) 6 - 29 U/L  Hemoglobin A1c  Result Value Ref Range   Hgb A1c MFr Bld 7.6 (H) <5.7 % of total Hgb   Mean Plasma Glucose 171 mg/dL   eAG (mmol/L) 9.5 mmol/L      Assessment & Plan:   Problem List Items Addressed This Visit       Cardiovascular and Mediastinum   Hypertension    Chronic.  Controlled.  Continue with current medication regimen on Amlodipine 567mand Olmesartan 4051maily.  Labs ordered today.  Return to clinic in 6 months for reevaluation.  Call sooner if concerns arise.        Relevant Orders   Comp Met (CMET)   Lipid Profile     Respiratory   OSA (obstructive sleep apnea)    Patient recently had a sleep study done.  Will follow up with Pulmonology for results.        Endocrine   Type 2 diabetes mellitus without complication, without long-term current use of insulin ([facility]    Labs ordered today. Patient has been on Metformin 500m15mD.  Will make further recommendations based on lab results. Would benefit from a statin. Will discuss at next visit.      Relevant Orders   HgB A1c   Lipid Profile     Other   Class 3 severe obesity due to excess calories without serious comorbidity with body mass index (BMI) of 40.0 to 44.9 in adult ([facility] Recommend a health lifestyle through diet and exercise.      Morbid obesity ([facility])[place] Recommend a healthy lifestyle through diet and exercise.      Relevant Orders   Lipid Profile   Other Visit Diagnoses     Encounter to establish care            Follow  up plan: Return in about 3 months (around 09/22/2021) for Physical and Fasting labs.

## 2021-06-23 NOTE — Patient Instructions (Signed)
If you have any concerns or questions, please feel free to call our office.   Hernia, Adult   A hernia is the bulging of an organ or tissue through a weak spot in the muscles of the abdomen. Hernias develop most often near the belly button (navel) or the area where the leg meets the lower abdomen (groin). Common types of hernias include: Incisional hernia. This type bulges through a scar from an abdominal surgery. Umbilical hernia. This type develops near the navel. Inguinal hernia. This type develops in the groin or scrotum. Femoral hernia. This type develops below the groin, in the upper thigh area. Hiatal hernia. This type occurs when part of the stomach slides above the muscle that separates the abdomen from the chest (diaphragm). What are the causes? This condition may be caused by: Heavy lifting. Coughing over a long period of time. Straining to have a bowel movement. Constipation can lead to straining. An incision made during abdominal surgery. A physical problem that is present at birth (congenital defect). Being overweight or obese. Smoking. Excess fluid in the abdomen. Undescended testicles in males. What are the signs or symptoms? The main symptom is a skin-colored, rounded bulge in the area of the hernia. However, a bulge may not always be present. It may grow bigger or be more visible when you cough or strain (such as when lifting something heavy). A hernia that can be pushed back into the abdomen (is reducible) rarely causes pain. A hernia that cannot be pushed back into the abdomen (is incarcerated) may lose its blood supply (become strangulated). A hernia that is incarcerated may cause: Pain. Fever. Nausea and vomiting. Swelling. Constipation. How is this diagnosed? A hernia may be diagnosed based on: Your symptoms and medical history. A physical exam. Your health care provider may ask you to cough or move in certain ways to see if the hernia becomes visible. Imaging  tests, such as: X-rays. Ultrasound. CT scan. How is this treated? A hernia that is small and painless may not need to be treated. A hernia that is large or painful may be treated with surgery. Inguinal hernias may be treated with surgery to prevent incarceration or strangulation. Strangulated hernias are always treated with surgery because the strangulation causes a lack of blood supply to the trapped organ or tissue. Surgery to treat a hernia involves pushing the bulge back into place and repairing the weak area of the muscle or abdominal wall. Follow these instructions at home: Activity Avoid straining. Do not lift anything that is heavier than 10 lb (4.5 kg), or the limit that you are told, until your health care provider says that it is safe. When lifting heavy objects, lift with your leg muscles, not your back muscles. Preventing constipation Take actions to prevent constipation. Constipation leads to straining with bowel movements, which can make a hernia worse or cause a hernia repair to break down. Your health care provider may recommend that you take these actions to prevent or treat constipation: Drink enough fluid to keep your urine pale yellow. Take over-the-counter or prescription medicines. Eat foods that are high in fiber, such as beans, whole grains, and fresh fruits and vegetables. Limit foods that are high in fat and processed sugars, such as fried or sweet foods. General instructions When coughing, try to cough gently. You may try to push the hernia back in place by very gently pressing on it while lying down. Do not try to force the bulge back in if it will  not push in easily. If you are overweight, work with your health care provider to lose weight safely. Do not use any products that contain nicotine or tobacco. These products include cigarettes, chewing tobacco, and vaping devices, such as e-cigarettes. If you need help quitting, ask your health care provider. If you are  scheduled for hernia repair, watch your hernia for any changes in shape, size, or color. Tell your health care provider about any changes or new symptoms. Take over-the-counter and prescription medicines only as told by your health care provider. Keep all follow-up visits. This is important. Contact a health care provider if: You develop new pain, swelling, or redness around your hernia. You have signs of constipation, such as: Fewer bowel movements in a week than normal. Difficulty having a bowel movement. Stools that are dry, hard, or larger than normal. Get help right away if: You have a fever or chills. You have abdominal pain that gets worse. You feel nauseous or you vomit. You cannot push the hernia back in place by very gently pressing on it while lying down. Do not try to force the bulge back in if it will not go in easily. The hernia: Changes in shape, size, or color. Feels hard or tender. These symptoms may represent a serious problem that is an emergency. Do not wait to see if the symptoms will go away. Get medical help right away. Call your local emergency services (911 in the [place] Do not drive yourself to the hospital. Summary A hernia is the bulging of an organ or tissue through a weak spot in the muscles of the abdomen. The main symptom is a skin-colored bulge in the hernia area. However, a bulge may not always be present. It may grow bigger or more visible when you cough or strain (such as when having a bowel movement). A hernia that is small and painless may not need to be treated. A hernia that is large or painful may be treated with surgery. Surgery to treat a hernia involves pushing the bulge back into place and repairing the weak part of the abdomen. This information is not intended to replace advice given to you by your health care provider. Make sure you discuss any questions you have with your health care provider. Document Revised: 01/29/2020 Document Reviewed:  01/29/2020 Elsevier Patient Education  [place].

## 2021-06-23 NOTE — Progress Notes (Signed)
06/23/2021  History of Present Illness: Carla Gill is a 50 y.o. female presenting for H&P update in anticipation of robotic assisted incisional/umbilical hernia repair on 07/17/21.  Patient reports that the hernia area sometimes is more uncomfortable than others, but denies any worsening pain.  Denies any nausea or vomiting.  Denies any chest pain or shortness of breath.  Does report that with the winter time, she gets some colds, but is doing well otherwise.  Past Medical History: Past Medical History:  Diagnosis Date   Arthritis    Asthma    Diabetes ([place])    Environmental and seasonal allergies    Hypertension      Past Surgical History: Past Surgical History:  Procedure Laterality Date   [place]   FOOT SURGERY     TUBAL LIGATION      Home Medications: Prior to Admission medications   Medication Sig Start Date End Date Taking? Authorizing Provider  albuterol (VENTOLIN HFA) 108 (90 Base) MCG/ACT inhaler Inhale 1 puff into the lungs every 6 (six) hours as needed for wheezing or shortness of breath. 05/14/21  Yes Masoud, Viann Shove, MD  amLODipine (NORVASC) 5 MG tablet Take 5 mg by mouth daily. 12/06/20  Yes [provider]  metFORMIN (GLUCOPHAGE) 500 MG tablet Take 1 tablet (500 mg total) by mouth 2 (two) times daily. 05/14/21  Yes Masoud, Viann Shove, MD  naproxen (NAPROSYN) 500 MG tablet Take 1 tablet (500 mg total) by mouth 2 (two) times daily with a meal for 3 days. 06/20/21 06/23/21 Yes Veronese, [place], MD  olmesartan (BENICAR) 40 MG tablet Take 1 tablet (40 mg total) by mouth daily. 05/14/21  Yes Cletis Athens, MD  Sodium Sulfate-Mag Sulfate-KCl (SUTAB) [phone] MG TABS At 5 PM take 12 tablets using the 8 oz cup provided in the kit drinking 5 cups of water and 5 hours before your procedure repeat the same process. 05/19/21  Yes Jonathon Bellows, MD    Allergies: Allergies  Allergen Reactions   Penicillins     Review of  Systems: Review of Systems  Constitutional:  Negative for chills and fever.  Respiratory:  Negative for shortness of breath.   Cardiovascular:  Negative for chest pain.  Gastrointestinal:  Positive for abdominal pain. Negative for nausea and vomiting.  Skin:  Negative for rash.   Physical Exam BP 126/80    Pulse 74    Temp 98.6 F (37 C) (Oral)    Ht _0  (1.549 m)    Wt 213 lb 12.8 oz (97 kg)    SpO2 98%    BMI 40.40 kg/m  CONSTITUTIONAL: No acute distress HEENT:  Normocephalic, atraumatic, extraocular motion intact. RESPIRATORY:  Lungs are clear, and breath sounds are equal bilaterally. Normal respiratory effort without pathologic use of accessory muscles. CARDIOVASCULAR: Heart is regular without murmurs, gallops, or rubs. GI: The abdomen is soft, non-distended, with some discomfort to palpation in the supraumbilical area where the patient's hernia is located.  This is overall with some discomfort to palpation, and I'm unable to reduce it due to discomfort.  NEUROLOGIC:  Motor and sensation is grossly normal.  Cranial nerves are grossly intact. PSYCH:  Alert and oriented to person, place and time. Affect is normal.  Labs/Imaging: None new  Assessment and Plan: This is a 50 y.o. female with an incisional / umbilical hernia.  --Discussed with the patient again the plan for surgery being minimally invasive, that this is outpatient surgery.  Her daughter  will bring her to the hospital prior to work, and will pick her up after she's done at work.  I will call her daughter after surgery to update her.   --Reviewed with the patient again the activity restrictions after surgery.  She's taking FMLA for this and we'll write a note today that she would be out of work for 6 weeks as part of her post-op recovery.  She will bring FMLA paperwork for [place] to complete. --Patient is scheduled for 07/17/21.  I spent 25 minutes dedicated to the care of this patient on the date of this encounter to include  pre-visit review of records, face-to-face time with the patient discussing diagnosis and management, and any post-visit coordination of care.   Melvyn Neth, [facility]

## 2021-06-23 NOTE — H&P (View-Only) (Signed)
06/23/2021  History of Present Illness: Carla Gill is a 50 y.o. female presenting for H&P update in anticipation of robotic assisted incisional/umbilical hernia repair on 07/17/21.  Patient reports that the hernia area sometimes is more uncomfortable than others, but denies any worsening pain.  Denies any nausea or vomiting.  Denies any chest pain or shortness of breath.  Does report that with the winter time, she gets some colds, but is doing well otherwise.  Past Medical History: Past Medical History:  Diagnosis Date   Arthritis    Asthma    Diabetes ([place])    Environmental and seasonal allergies    Hypertension      Past Surgical History: Past Surgical History:  Procedure Laterality Date   [place]   FOOT SURGERY     TUBAL LIGATION      Home Medications: Prior to Admission medications   Medication Sig Start Date End Date Taking? Authorizing Provider  albuterol (VENTOLIN HFA) 108 (90 Base) MCG/ACT inhaler Inhale 1 puff into the lungs every 6 (six) hours as needed for wheezing or shortness of breath. 05/14/21  Yes Masoud, Viann Shove, MD  amLODipine (NORVASC) 5 MG tablet Take 5 mg by mouth daily. 12/06/20  Yes [provider]  metFORMIN (GLUCOPHAGE) 500 MG tablet Take 1 tablet (500 mg total) by mouth 2 (two) times daily. 05/14/21  Yes Masoud, Viann Shove, MD  naproxen (NAPROSYN) 500 MG tablet Take 1 tablet (500 mg total) by mouth 2 (two) times daily with a meal for 3 days. 06/20/21 06/23/21 Yes Veronese, [place], MD  olmesartan (BENICAR) 40 MG tablet Take 1 tablet (40 mg total) by mouth daily. 05/14/21  Yes Cletis [place], MD  Sodium Sulfate-Mag Sulfate-KCl (SUTAB) [phone] MG TABS At 5 PM take 12 tablets using the 8 oz cup provided in the kit drinking 5 cups of water and 5 hours before your procedure repeat the same process. 05/19/21  Yes Jonathon Bellows, MD    Allergies: Allergies  Allergen Reactions   Penicillins     Review of  Systems: Review of Systems  Constitutional:  Negative for chills and fever.  Respiratory:  Negative for shortness of breath.   Cardiovascular:  Negative for chest pain.  Gastrointestinal:  Positive for abdominal pain. Negative for nausea and vomiting.  Skin:  Negative for rash.   Physical Exam BP 126/80    Pulse 74    Temp 98.6 F (37 C) (Oral)    Ht _0  (1.549 m)    Wt 213 lb 12.8 oz (97 kg)    SpO2 98%    BMI 40.40 kg/m  CONSTITUTIONAL: No acute distress HEENT:  Normocephalic, atraumatic, extraocular motion intact. RESPIRATORY:  Lungs are clear, and breath sounds are equal bilaterally. Normal respiratory effort without pathologic use of accessory muscles. CARDIOVASCULAR: Heart is regular without murmurs, gallops, or rubs. GI: The abdomen is soft, non-distended, with some discomfort to palpation in the supraumbilical area where the patient's hernia is located.  This is overall with some discomfort to palpation, and I'm unable to reduce it due to discomfort.  NEUROLOGIC:  Motor and sensation is grossly normal.  Cranial nerves are grossly intact. PSYCH:  Alert and oriented to person, place and time. Affect is normal.  Labs/Imaging: None new  Assessment and Plan: This is a 50 y.o. female with an incisional / umbilical hernia.  --Discussed with the patient again the plan for surgery being minimally invasive, that this is outpatient surgery.  Her daughter  will bring her to the hospital prior to work, and will pick her up after she's done at work.  I will call her daughter after surgery to update her.   --Reviewed with the patient again the activity restrictions after surgery.  She's taking FMLA for this and we'll write a note today that she would be out of work for 6 weeks as part of her post-op recovery.  She will bring FMLA paperwork for [place] to complete. --Patient is scheduled for 07/17/21.  I spent 25 minutes dedicated to the care of this patient on the date of this encounter to include  pre-visit review of records, face-to-face time with the patient discussing diagnosis and management, and any post-visit coordination of care.   Melvyn Neth, [facility]

## 2021-06-24 ENCOUNTER — Ambulatory Visit: Payer: Federal, State, Local not specified - PPO | Admitting: Nurse Practitioner

## 2021-06-24 ENCOUNTER — Encounter: Payer: Self-pay | Admitting: Nurse Practitioner

## 2021-06-24 VITALS — BP 121/72 | HR 69 | Temp 98.5°F | Ht 62.6 in | Wt 211.6 lb

## 2021-06-24 DIAGNOSIS — G4733 Obstructive sleep apnea (adult) (pediatric): Secondary | ICD-10-CM | POA: Diagnosis not present

## 2021-06-24 DIAGNOSIS — Z6841 Body Mass Index (BMI) 40.0 and over, adult: Secondary | ICD-10-CM

## 2021-06-24 DIAGNOSIS — I10 Essential (primary) hypertension: Secondary | ICD-10-CM | POA: Diagnosis not present

## 2021-06-24 DIAGNOSIS — E119 Type 2 diabetes mellitus without complications: Secondary | ICD-10-CM

## 2021-06-24 DIAGNOSIS — Z7689 Persons encountering health services in other specified circumstances: Secondary | ICD-10-CM

## 2021-06-24 LAB — HM MAMMOGRAPHY

## 2021-06-24 NOTE — Assessment & Plan Note (Signed)
Recommend a health lifestyle through diet and exercise.

## 2021-06-24 NOTE — Assessment & Plan Note (Signed)
Labs ordered today. Patient has been on Metformin 500mg  BID.  Will make further recommendations based on lab results. Would benefit from a statin. Will discuss at next visit.

## 2021-06-24 NOTE — Assessment & Plan Note (Signed)
Recommend a healthy lifestyle through diet and exercise.  °

## 2021-06-24 NOTE — Assessment & Plan Note (Signed)
Chronic.  Controlled.  Continue with current medication regimen on Amlodipine 5mg  and Olmesartan 40mg  daily.  Labs ordered today.  Return to clinic in 6 months for reevaluation.  Call sooner if concerns arise.

## 2021-06-24 NOTE — Assessment & Plan Note (Signed)
Patient recently had a sleep study done.  Will follow up with Pulmonology for results.

## 2021-06-25 LAB — LIPID PANEL
Chol/HDL Ratio: 3 ratio (ref 0.0–4.4)
Cholesterol, Total: 203 mg/dL — ABNORMAL HIGH (ref 100–199)
HDL: 67 mg/dL (ref >39)
LDL Chol Calc (NIH): 126 mg/dL — ABNORMAL HIGH (ref 0–99)
Triglycerides: 56 mg/dL (ref 0–149)
VLDL Cholesterol Cal: 10 mg/dL (ref 5–40)

## 2021-06-25 LAB — COMPREHENSIVE METABOLIC PANEL
ALT: 51 IU/L — ABNORMAL HIGH (ref 0–32)
AST: 27 IU/L (ref 0–40)
Albumin/Globulin Ratio: 1.8 (ref 1.2–2.2)
Albumin: 4.4 g/dL (ref 3.8–4.9)
Alkaline Phosphatase: 97 IU/L (ref 44–121)
BUN/Creatinine Ratio: 13 (ref 9–23)
BUN: 10 mg/dL (ref 6–24)
Bilirubin Total: 0.3 mg/dL (ref 0.0–1.2)
CO2: 26 mmol/L (ref 20–29)
Calcium: 9.5 mg/dL (ref 8.7–10.2)
Chloride: 101 mmol/L (ref 96–106)
Creatinine, Ser: 0.79 mg/dL (ref 0.57–1.00)
Globulin, Total: 2.4 g/dL (ref 1.5–4.5)
Glucose: 160 mg/dL — ABNORMAL HIGH (ref 70–99)
Potassium: 4.2 mmol/L (ref 3.5–5.2)
Sodium: 140 mmol/L (ref 134–144)
Total Protein: 6.8 g/dL (ref 6.0–8.5)
eGFR: 91 mL/min/{1.73_m2} (ref >59)

## 2021-06-25 LAB — HEMOGLOBIN A1C
Est. average glucose Bld gHb Est-mCnc: 163 mg/dL
Hgb A1c MFr Bld: 7.3 % — ABNORMAL HIGH (ref 4.8–5.6)

## 2021-06-25 MED ORDER — ROSUVASTATIN CALCIUM 5 MG PO TABS: 5.0000 mg | ORAL_TABLET | Freq: Every day | ORAL | 1 refills | Status: DC

## 2021-06-25 NOTE — Progress Notes (Signed)
Please let patient know that her lab work looks good.  Her liver enzymes are slightly elevated and something we will monitor over time.    Her A1c is well controlled at 7.3.  Continue with current medication regimen.  Patient's cholesterol is elevated. I do recommend she restart a Statin called Crestor 5mg  once daily. The goal will be to increase to 20mg  if she tolerates it well.  If she agrees, please let me know and I will send it to the pharmacy.  I will see her at our next visit.

## 2021-06-25 NOTE — Progress Notes (Signed)
Medication sent to the pharmacy.

## 2021-06-25 NOTE — Addendum Note (Signed)
Addended by: Jon Billings on: 06/25/2021 01:22 PM   Modules accepted: Orders

## 2021-07-02 ENCOUNTER — Telehealth: Payer: Self-pay

## 2021-07-02 NOTE — Telephone Encounter (Signed)
Medical Clearance faxed to Jon Billings.

## 2021-07-04 ENCOUNTER — Telehealth: Payer: Self-pay

## 2021-07-04 NOTE — Telephone Encounter (Signed)
Received surgical clearance for patient. Please call and schedule appointment next week for this.  Form placed in the incomplete bin until appointment.

## 2021-07-04 NOTE — Telephone Encounter (Signed)
Appt scheduled for 1/5 at 2:40 pm

## 2021-07-08 DIAGNOSIS — G5602 Carpal tunnel syndrome, left upper limb: Secondary | ICD-10-CM | POA: Diagnosis not present

## 2021-07-08 DIAGNOSIS — M25532 Pain in left wrist: Secondary | ICD-10-CM | POA: Diagnosis not present

## 2021-07-09 ENCOUNTER — Ambulatory Visit: Payer: Federal, State, Local not specified - PPO | Admitting: Internal Medicine

## 2021-07-09 ENCOUNTER — Other Ambulatory Visit: Payer: Self-pay

## 2021-07-09 ENCOUNTER — Encounter
Admission: RE | Admit: 2021-07-09 | Discharge: 2021-07-09 | Disposition: A | Discharge status: Home / Self Care | Payer: Federal, State, Local not specified - PPO | Source: Ambulatory Visit | Attending: Surgery | Admitting: Surgery

## 2021-07-09 NOTE — Patient Instructions (Signed)
Your procedure is scheduled on:07-17-21 Thursday Report to the Registration Desk on the 1st floor of the [facility].Then proceed to the 2nd floor Surgery Desk in the [place] [facility] To find out your arrival time, please call [phone] between 1PM - 3PM on:07-16-21 Wednesday  REMEMBER: Instructions that are not followed completely may result in serious medical risk, up to and including death; or upon the discretion of your surgeon and anesthesiologist your surgery may need to be rescheduled.  Do not eat food after midnight the night before surgery.  No gum chewing, lozengers or hard candies.  You may however, drink Water up to 2 hours before you are scheduled to arrive for your surgery. Do not drink anything within 2 hours of your scheduled arrival time.  Type 1 and Type 2 diabetics should only drink water.  TAKE THESE MEDICATIONS THE MORNING OF SURGERY WITH A SIP OF WATER: -amLODipine (NORVASC)  -rosuvastatin (CRESTOR)   Use your albuterol (VENTOLIN HFA) 108 (90 Base) MCG/ACT inhaler the morning of surgery and bring inhaler to the hospital  Stop your metFORMIN (GLUCOPHAGE) 2 days prior to surgery-Last dose on 07-14-21 Monday  One week prior to surgery: Stop Anti-inflammatories (NSAIDS) such as Advil, Aleve, Ibuprofen, Motrin, Naproxen, Naprosyn and Aspirin based products such as Excedrin, Goodys Powder, BC Powder.You may however, take Tylenol if needed for pain up until the day of surgery.  Stop ANY OVER THE COUNTER supplement/vitamins NOW (07-09-21) until after surgery (BLACK ELDERBERRY , Magnesium , Multiple Vitamin , and HAIR/SKIN/NAILS/BIOTIN)  No Alcohol for 24 hours before or after surgery.  No Smoking including e-cigarettes for 24 hours prior to surgery.  No chewable tobacco products for at least 6 hours prior to surgery.  No nicotine patches on the day of surgery.  Do not use any "recreational" drugs for at least a week prior to your surgery.  Please be advised that the  combination of cocaine and anesthesia may have negative outcomes, up to and including death. If you test positive for cocaine, your surgery will be cancelled.  On the morning of surgery brush your teeth with toothpaste and water, you may rinse your mouth with mouthwash if you wish. Do not swallow any toothpaste or mouthwash.  Use CHG Soap as directed on instruction sheet.  Do not wear jewelry, make-up, hairpins, clips or nail polish.  Do not wear lotions, powders, or perfumes.   Do not shave body from the neck down 48 hours prior to surgery just in case you cut yourself which could leave a site for infection.  Also, freshly shaved skin may become irritated if using the CHG soap.  Contact lenses, hearing aids and dentures may not be worn into surgery.  Do not bring valuables to the hospital. [facility] is not responsible for any missing/lost belongings or valuables.   Notify your doctor if there is any change in your medical condition (cold, fever, infection).  Wear comfortable clothing (specific to your surgery type) to the hospital.  After surgery, you can help prevent lung complications by doing breathing exercises.  Take deep breaths and cough every 1-2 hours. Your doctor may order a device called an Incentive Spirometer to help you take deep breaths. When coughing or sneezing, hold a pillow firmly against your incision with both hands. This is called splinting. Doing this helps protect your incision. It also decreases belly discomfort.  If you are being admitted to the hospital overnight, leave your suitcase in the car. After surgery it may be brought  to your room.  If you are being discharged the day of surgery, you will not be allowed to drive home. You will need a responsible adult (18 years or older) to drive you home and stay with you that night.   If you are taking public transportation, you will need to have a responsible adult (18 years or older) with you. Please  confirm with your physician that it is acceptable to use public transportation.   Please call the [facility] Dept. at [phone] if you have any questions about these instructions.  Surgery Visitation Policy:  Patients undergoing a surgery or procedure may have one family member or support person with them as long as that person is not COVID-19 positive or experiencing its symptoms.  That person may remain in the waiting area during the procedure and may rotate out with other people.  Inpatient Visitation:    Visiting hours are 7 a.m. to 8 p.m. Up to two visitors ages 16+ are allowed at one time in a patient room. The visitors may rotate out with other people during the day. Visitors must check out when they leave, or other visitors will not be allowed. One designated support person may remain overnight. The visitor must pass COVID-19 screenings, use hand sanitizer when entering and exiting the patients room and wear a mask at all times, including in the patients room. Patients must also wear a mask when staff or their visitor are in the room. Masking is required regardless of vaccination status.

## 2021-07-10 ENCOUNTER — Encounter: Payer: Self-pay | Admitting: Nurse Practitioner

## 2021-07-10 ENCOUNTER — Ambulatory Visit: Payer: Federal, State, Local not specified - PPO | Admitting: Nurse Practitioner

## 2021-07-10 VITALS — BP 110/61 | HR 73 | Temp 97.9°F | Wt 213.6 lb

## 2021-07-10 DIAGNOSIS — G4733 Obstructive sleep apnea (adult) (pediatric): Secondary | ICD-10-CM

## 2021-07-10 DIAGNOSIS — I10 Essential (primary) hypertension: Secondary | ICD-10-CM | POA: Diagnosis not present

## 2021-07-10 DIAGNOSIS — Z01818 Encounter for other preprocedural examination: Secondary | ICD-10-CM | POA: Diagnosis not present

## 2021-07-10 DIAGNOSIS — E119 Type 2 diabetes mellitus without complications: Secondary | ICD-10-CM | POA: Diagnosis not present

## 2021-07-10 DIAGNOSIS — J4520 Mild intermittent asthma, uncomplicated: Secondary | ICD-10-CM | POA: Diagnosis not present

## 2021-07-10 LAB — URINALYSIS, ROUTINE W REFLEX MICROSCOPIC
Bilirubin, UA: NEGATIVE
Glucose, UA: NEGATIVE
Ketones, UA: NEGATIVE
Leukocytes,UA: NEGATIVE
Nitrite, UA: NEGATIVE
Protein,UA: NEGATIVE
RBC, UA: NEGATIVE
Specific Gravity, UA: >1.03 — ABNORMAL HIGH (ref 1.005–1.030)
Urobilinogen, Ur: 0.2 mg/dL (ref 0.2–1.0)
pH, UA: 5.5 (ref 5.0–7.5)

## 2021-07-10 NOTE — Progress Notes (Signed)
Results discussed with patient during visit.

## 2021-07-10 NOTE — Assessment & Plan Note (Signed)
Recommend a healthy lifestyle through diet and exercise.  °

## 2021-07-10 NOTE — Assessment & Plan Note (Signed)
Chronic.  Controlled.  Continue with current medication regimen.  Labs ordered today.  Return to clinic in 6 months for reevaluation.  Call sooner if concerns arise.  ? ?

## 2021-07-10 NOTE — Pre-Procedure Instructions (Addendum)
Medical clearance in Epic under 07-10-21 visit with Jon Billings NP-Acceptable risk

## 2021-07-10 NOTE — Progress Notes (Signed)
BP 110/61    Pulse 73    Temp 97.9 F (36.6 C) (Oral)    Wt 213 lb 9.6 oz (96.9 kg)    SpO2 97%    BMI 38.32 kg/m    Subjective:    Patient ID: Carla Gill, female    DOB: 1971/01/28, 51 y.o.   MRN: [id number]  HPI: Carla Gill is a 51 y.o. female  Chief Complaint  Patient presents with   Surgery Clearance   07/10/2021  Assessment:  Preoperative evaluation and clearance show: No contraindications to planned surgery and patient is cleared for diagnosis for surgical procedure.  Other diagnoses affecting surgical risk include: Other OSA- does not use CPAP, Obesity,  asthma, Type 2 diabetes, and HTN.   Type of Surgery: Intermediate, 1% - 5% cardiac risk (major intraabdominal, intrathoracic, orthopedic, major head & neck, prostatectomy)   Lee's Revised Cardiac Index: 0 Risk class I, very low, 0.4% risk of cardiac complications   Plan:  1. Patient requires endocarditis prophylaxis: no. 2. GENERAL PREOP INSTRUCTIONS: Proceed with surgery as planned. No food or liquids the morning of surgery. Call surgeon if develops respiratory illness, fever, or other illness. 3. Medications to Hold: NSAIDS and ASA for 7 days before surgery, unless instructed otherwise by surgeon. 4. EKG:  07/10/2021 : No acute ST changes noted. 5. Ordered Labs: CMP, CBC, A1c and UA  From a medical standpoint the patient is an acceptable candidates to undergo general anesthesia for surgery.  It is recommended to correct electrolytes and to keep the patient euvolemic and avoid significant fluid shifts during surgery.  Labs reviewed and pt is cleared for surgery. Pt denies a hx of adverse reactions to anesthesia.    Carla Gill is a 51 y.o. female who presents to the office today for a preoperative consultation at the request of Dr. Hampton Abbot, who will perform a  Ventral Hernia Repair on 07/17/2021 at  [facility] .  Current Complaints: None  Past Medical History:  Diagnosis Date   Arthritis     Asthma    well controlled   Diabetes ([place])    Environmental and seasonal allergies    Hypertension    Family History  Problem Relation Age of Onset   Diabetes Mother    Hypertension Mother    Heart disease Father    Diabetes Sister    Hypertension Sister    Hypertension Sister    Hypertension Sister    Hypertension Sister    Diabetes Brother    Hypertension Brother    Heart disease Brother    Mental illness Brother    Depression Brother    Diabetes Brother    Hypertension Brother    Hypertension Brother    Asthma Son    Diabetes Maternal Grandmother    Hypertension Paternal Grandmother    Hypertension Paternal Grandfather    Current Outpatient Medications  Medication Sig Dispense Refill   albuterol (VENTOLIN HFA) 108 (90 Base) MCG/ACT inhaler Inhale 1 puff into the lungs every 6 (six) hours as needed for wheezing or shortness of breath. 18 g 2   amLODipine (NORVASC) 5 MG tablet Take 5 mg by mouth every morning.     BLACK ELDERBERRY PO Take 1 capsule by mouth daily. With vitamin c and zinc     diphenhydrAMINE (BENADRYL) 25 MG tablet Take 25 mg by mouth daily as needed for allergies.     Lidocaine 4 % PTCH Apply 1 patch topically daily as needed (pain).  Magnesium 250 MG TABS Take 250 mg by mouth daily.     metFORMIN (GLUCOPHAGE) 500 MG tablet Take 1 tablet (500 mg total) by mouth 2 (two) times daily. 180 tablet 1   Multiple Vitamin (MULTIVITAMIN WITH MINERALS) TABS tablet Take 1 tablet by mouth daily.     Multiple Vitamins-Minerals (HAIR/SKIN/NAILS/BIOTIN) TABS Take 2 tablets by mouth daily.     olmesartan (BENICAR) 40 MG tablet Take 1 tablet (40 mg total) by mouth daily. (Patient taking differently: Take 40 mg by mouth every morning.) 90 tablet 1   PRESCRIPTION MEDICATION Apply 1 application topically daily as needed (pain). Gabapentin 10%  topical cream     rosuvastatin (CRESTOR) 5 MG tablet Take 1 tablet (5 mg total) by mouth daily. (Patient taking differently: Take 5  mg by mouth every morning.) 90 tablet 1   No current facility-administered medications for this visit.   Allergies  Allergen Reactions   Penicillins Hives   Social History   Socioeconomic History   Marital status: Single    Spouse name: Not on file   Number of children: Not on file   Years of education: Not on file   Highest education level: Not on file  Occupational History   Not on file  Tobacco Use   Smoking status: Former    Packs/day: 0.25    Years: 25.00    Pack years: 6.25    Types: Cigarettes    Quit date: 07/02/2021    Years since quitting: 0.0   Smokeless tobacco: Never  Vaping Use   Vaping Use: Never used  Substance and Sexual Activity   Alcohol use: Never   Drug use: Never   Sexual activity: Not Currently  Other Topics Concern   Not on file  Social History Narrative   Not on file   Social Determinants of Health   Financial Resource Strain: Not on file  Food Insecurity: Not on file  Transportation Needs: Not on file  Physical Activity: Not on file  Stress: Not on file  Social Connections: Not on file  Intimate Partner Violence: Not on file     Preoperative Risk Factors  1. Major predictors that require intensive management and may lead to delay in or cancellation of the operative procedure unless emergent:  No Unstable coronary syndromes including unstable or severe angina or recent MI  No Decompensated heart failure including NYHA functional class 4 or worsening or new onset HF  no Significant arrhythmia including high grade AV block, symptomatic ventricular arrhythmias, supraventricular arrhythmias with a ventricular rate >100 bpm at rest, symptomatic bradycardia, and newly recognized ventricular tachycardia  No Severe heart valve disease including severe aortic stenosis or symptomatic mitral stenosis  2. Additional independent predictors of major cardiac complications:  No Hgh risk type of surgery (Vascular surgery and any open  intraperitoneal or intrathoracic procedures)  Other clinical predictors that warrant careful assessment of current status  No History of ischemic heart disease No History of CVA No  History of compensated heart failure or prior heart failure No Diabetes mellitus on Insulin No Renal insufficiency  3. Perioperative cardiac and long term risk is increased in pts unable to meet a 4-MET demand during most normal daily activities:  No Ability to climb 2 flights of stairs or walk four blocks  Objective:  BP 110/61    Pulse 73    Temp 97.9 F (36.6 C) (Oral)    Wt 213 lb 9.6 oz (96.9 kg)    SpO2 97%  BMI 38.32 kg/m   Body mass index is 38.32 kg/m.     Jon Billings 07/10/21  Relevant past medical, surgical, family and social history reviewed and updated as indicated. Interim medical history since our last visit reviewed. Allergies and medications reviewed and updated.  Review of Systems  Eyes:  Negative for visual disturbance.  Respiratory:  Negative for cough, chest tightness and shortness of breath.   Cardiovascular:  Negative for chest pain, palpitations and leg swelling.  Neurological:  Negative for dizziness and headaches.   Per HPI unless specifically indicated above     Objective:    BP 110/61    Pulse 73    Temp 97.9 F (36.6 C) (Oral)    Wt 213 lb 9.6 oz (96.9 kg)    SpO2 97%    BMI 38.32 kg/m   Wt Readings from Last 3 Encounters:  07/10/21 213 lb 9.6 oz (96.9 kg)  06/24/21 211 lb 9.6 oz (96 kg)  06/23/21 213 lb 12.8 oz (97 kg)    Physical Exam Vitals and nursing note reviewed.  Constitutional:      General: She is awake. She is not in acute distress.    Appearance: She is well-developed. She is obese. She is not ill-appearing.  HENT:     Head: Normocephalic and atraumatic.     Right Ear: Hearing, tympanic membrane, ear canal and external ear normal. No drainage.     Left Ear: Hearing, tympanic membrane, ear canal and external ear normal. No drainage.      Nose: Nose normal.     Right Sinus: No maxillary sinus tenderness or frontal sinus tenderness.     Left Sinus: No maxillary sinus tenderness or frontal sinus tenderness.     Mouth/Throat:     Mouth: Mucous membranes are moist.     Pharynx: Oropharynx is clear. Uvula midline. No pharyngeal swelling, oropharyngeal exudate or posterior oropharyngeal erythema.  Eyes:     General: Lids are normal.        Right eye: No discharge.        Left eye: No discharge.     Extraocular Movements: Extraocular movements intact.     Conjunctiva/sclera: Conjunctivae normal.     Pupils: Pupils are equal, round, and reactive to light.     Visual Fields: Right eye visual fields normal and left eye visual fields normal.  Neck:     Thyroid: No thyromegaly.     Vascular: No carotid bruit.     Trachea: Trachea normal.  Cardiovascular:     Rate and Rhythm: Normal rate and regular rhythm.     Heart sounds: Normal heart sounds. No murmur heard.   No gallop.  Pulmonary:     Effort: Pulmonary effort is normal. No accessory muscle usage or respiratory distress.     Breath sounds: Normal breath sounds.  Chest:  Breasts:    Right: Normal.     Left: Normal.  Abdominal:     General: Bowel sounds are normal.     Palpations: Abdomen is soft. There is no hepatomegaly or splenomegaly.     Tenderness: There is no abdominal tenderness.  Musculoskeletal:        General: Normal range of motion.     Cervical back: Normal range of motion and neck supple.     Right lower leg: No edema.     Left lower leg: No edema.  Lymphadenopathy:     Head:     Right side of head: No submental, submandibular,  tonsillar, preauricular or posterior auricular adenopathy.     Left side of head: No submental, submandibular, tonsillar, preauricular or posterior auricular adenopathy.     Cervical: No cervical adenopathy.     Upper Body:     Right upper body: No supraclavicular, axillary or pectoral adenopathy.     Left upper body: No  supraclavicular, axillary or pectoral adenopathy.  Skin:    General: Skin is warm and dry.     Capillary Refill: Capillary refill takes less than 2 seconds.     Findings: No rash.  Neurological:     Mental Status: She is alert and oriented to person, place, and time.     Gait: Gait is intact.     Deep Tendon Reflexes: Reflexes are normal and symmetric.     Reflex Scores:      Brachioradialis reflexes are 2+ on the right side and 2+ on the left side.      Patellar reflexes are 2+ on the right side and 2+ on the left side. Psychiatric:        Attention and Perception: Attention normal.        Mood and Affect: Mood normal.        Speech: Speech normal.        Behavior: Behavior normal. Behavior is cooperative.        Thought Content: Thought content normal.        Judgment: Judgment normal.    Results for orders placed or performed in visit on 06/24/21  Comp Met (CMET)  Result Value Ref Range   Glucose 160 (H) 70 - 99 mg/dL   BUN 10 6 - 24 mg/dL   Creatinine, Ser 0.79 0.57 - 1.00 mg/dL   eGFR 91 >59 mL/min/1.73   BUN/Creatinine Ratio 13 9 - 23   Sodium 140 134 - 144 mmol/L   Potassium 4.2 3.5 - 5.2 mmol/L   Chloride 101 96 - 106 mmol/L   CO2 26 20 - 29 mmol/L   Calcium 9.5 8.7 - 10.2 mg/dL   Total Protein 6.8 6.0 - 8.5 g/dL   Albumin 4.4 3.8 - 4.9 g/dL   Globulin, Total 2.4 1.5 - 4.5 g/dL   Albumin/Globulin Ratio 1.8 1.2 - 2.2   Bilirubin Total 0.3 0.0 - 1.2 mg/dL   Alkaline Phosphatase 97 44 - 121 IU/L   AST 27 0 - 40 IU/L   ALT 51 (H) 0 - 32 IU/L  HgB A1c  Result Value Ref Range   Hgb A1c MFr Bld 7.3 (H) 4.8 - 5.6 %   Est. average glucose Bld gHb Est-mCnc 163 mg/dL  Lipid Profile  Result Value Ref Range   Cholesterol, Total 203 (H) 100 - 199 mg/dL   Triglycerides 56 0 - 149 mg/dL   HDL 67 >39 mg/dL   VLDL Cholesterol Cal 10 5 - 40 mg/dL   LDL Chol Calc (NIH) 126 (H) 0 - 99 mg/dL   Chol/HDL Ratio 3.0 0.0 - 4.4 ratio      Assessment & Plan:   Problem List Items  Addressed This Visit       Cardiovascular and Mediastinum   Hypertension    Chronic.  Controlled.  Continue with current medication regimen.  Labs ordered today.  Return to clinic in 6 months for reevaluation.  Call sooner if concerns arise.          Respiratory   Mild intermittent asthma    Chronic.  Controlled.  Continue with current medication regimen.  Labs  ordered today.  Return to clinic in 6 months for reevaluation.  Call sooner if concerns arise.        OSA (obstructive sleep apnea)    Not using CPAP.         Endocrine   Type 2 diabetes mellitus without complication, without long-term current use of insulin (HCC)    Chronic.  Controlled.  Continue with current medication regimen.  Labs ordered today.  Return to clinic in 6 months for reevaluation.  Call sooner if concerns arise.        Relevant Orders   HgB A1c     Other   Morbid obesity ([place])    Recommend a healthy lifestyle through diet and exercise.      Other Visit Diagnoses     Pre-op evaluation    -  Primary   Cleared for surgery.  EKG reviewd, CBC, CMP, A1c and UA drawn during visit.   Relevant Orders   Comp Met (CMET)   HgB A1c   CBC w/Diff   Urinalysis, Routine w reflex microscopic   EKG 12-Lead (Completed)        Follow up plan: Return if symptoms worsen or fail to improve.

## 2021-07-10 NOTE — Assessment & Plan Note (Signed)
Not using CPAP.  

## 2021-07-11 ENCOUNTER — Encounter: Payer: Self-pay | Admitting: Gastroenterology

## 2021-07-11 LAB — COMPREHENSIVE METABOLIC PANEL
ALT: 52 IU/L — ABNORMAL HIGH (ref 0–32)
AST: 24 IU/L (ref 0–40)
Albumin/Globulin Ratio: 2 (ref 1.2–2.2)
Albumin: 4.6 g/dL (ref 3.8–4.9)
Alkaline Phosphatase: 91 IU/L (ref 44–121)
BUN/Creatinine Ratio: 21 (ref 9–23)
BUN: 18 mg/dL (ref 6–24)
Bilirubin Total: 0.3 mg/dL (ref 0.0–1.2)
CO2: 27 mmol/L (ref 20–29)
Calcium: 9.6 mg/dL (ref 8.7–10.2)
Chloride: 104 mmol/L (ref 96–106)
Creatinine, Ser: 0.84 mg/dL (ref 0.57–1.00)
Globulin, Total: 2.3 g/dL (ref 1.5–4.5)
Glucose: 168 mg/dL — ABNORMAL HIGH (ref 70–99)
Potassium: 4.1 mmol/L (ref 3.5–5.2)
Sodium: 140 mmol/L (ref 134–144)
Total Protein: 6.9 g/dL (ref 6.0–8.5)
eGFR: 84 mL/min/{1.73_m2} (ref >59)

## 2021-07-11 LAB — CBC WITH DIFFERENTIAL/PLATELET
Basophils Absolute: 0 10*3/uL (ref 0.0–0.2)
Basos: 0 %
EOS (ABSOLUTE): 0 10*3/uL (ref 0.0–0.4)
Eos: 1 %
Hematocrit: 37.7 % (ref 34.0–46.6)
Hemoglobin: 12.3 g/dL (ref 11.1–15.9)
Immature Grans (Abs): 0 10*3/uL (ref 0.0–0.1)
Immature Granulocytes: 0 %
Lymphocytes Absolute: 2.9 10*3/uL (ref 0.7–3.1)
Lymphs: 47 %
MCH: 30.4 pg (ref 26.6–33.0)
MCHC: 32.6 g/dL (ref 31.5–35.7)
MCV: 93 fL (ref 79–97)
Monocytes Absolute: 0.5 10*3/uL (ref 0.1–0.9)
Monocytes: 8 %
Neutrophils Absolute: 2.7 10*3/uL (ref 1.4–7.0)
Neutrophils: 44 %
Platelets: 199 10*3/uL (ref 150–450)
RBC: 4.04 x10E6/uL (ref 3.77–5.28)
RDW: 13.2 % (ref 11.7–15.4)
WBC: 6.2 10*3/uL (ref 3.4–10.8)

## 2021-07-11 LAB — HEMOGLOBIN A1C
Est. average glucose Bld gHb Est-mCnc: 174 mg/dL
Hgb A1c MFr Bld: 7.7 % — ABNORMAL HIGH (ref 4.8–5.6)

## 2021-07-11 NOTE — Progress Notes (Signed)
Hi Genae. Your lab work from yesterday looks good.  I will complete your pre op and get it sent to your surgeon.

## 2021-07-14 ENCOUNTER — Encounter
Admission: RE | Disposition: A | Discharge status: Home / Self Care | Payer: Self-pay | Source: Home / Self Care | Attending: Gastroenterology

## 2021-07-14 ENCOUNTER — Ambulatory Visit: Payer: Federal, State, Local not specified - PPO | Admitting: Anesthesiology

## 2021-07-14 ENCOUNTER — Ambulatory Visit
Admission: RE | Admit: 2021-07-14 | Discharge: 2021-07-14 | Disposition: A | Discharge status: Home / Self Care | Payer: Federal, State, Local not specified - PPO | Attending: Gastroenterology | Admitting: Gastroenterology

## 2021-07-14 ENCOUNTER — Encounter: Payer: Self-pay | Admitting: Gastroenterology

## 2021-07-14 DIAGNOSIS — G4733 Obstructive sleep apnea (adult) (pediatric): Secondary | ICD-10-CM | POA: Diagnosis not present

## 2021-07-14 DIAGNOSIS — D123 Benign neoplasm of transverse colon: Secondary | ICD-10-CM | POA: Diagnosis not present

## 2021-07-14 DIAGNOSIS — Z1211 Encounter for screening for malignant neoplasm of colon: Secondary | ICD-10-CM

## 2021-07-14 DIAGNOSIS — D126 Benign neoplasm of colon, unspecified: Secondary | ICD-10-CM

## 2021-07-14 DIAGNOSIS — E119 Type 2 diabetes mellitus without complications: Secondary | ICD-10-CM | POA: Diagnosis not present

## 2021-07-14 DIAGNOSIS — F17210 Nicotine dependence, cigarettes, uncomplicated: Secondary | ICD-10-CM | POA: Insufficient documentation

## 2021-07-14 DIAGNOSIS — Z9049 Acquired absence of other specified parts of digestive tract: Secondary | ICD-10-CM | POA: Insufficient documentation

## 2021-07-14 DIAGNOSIS — G4730 Sleep apnea, unspecified: Secondary | ICD-10-CM | POA: Diagnosis not present

## 2021-07-14 DIAGNOSIS — J45909 Unspecified asthma, uncomplicated: Secondary | ICD-10-CM | POA: Insufficient documentation

## 2021-07-14 DIAGNOSIS — K529 Noninfective gastroenteritis and colitis, unspecified: Secondary | ICD-10-CM | POA: Insufficient documentation

## 2021-07-14 DIAGNOSIS — M1990 Unspecified osteoarthritis, unspecified site: Secondary | ICD-10-CM | POA: Diagnosis not present

## 2021-07-14 DIAGNOSIS — I10 Essential (primary) hypertension: Secondary | ICD-10-CM | POA: Insufficient documentation

## 2021-07-14 DIAGNOSIS — R197 Diarrhea, unspecified: Secondary | ICD-10-CM

## 2021-07-14 DIAGNOSIS — K436 Other and unspecified ventral hernia with obstruction, without gangrene: Secondary | ICD-10-CM | POA: Insufficient documentation

## 2021-07-14 DIAGNOSIS — K635 Polyp of colon: Secondary | ICD-10-CM | POA: Diagnosis not present

## 2021-07-14 HISTORY — PX: COLONOSCOPY WITH PROPOFOL: SHX5780

## 2021-07-14 LAB — POCT PREGNANCY, URINE: Preg Test, Ur: NEGATIVE

## 2021-07-14 SURGERY — COLONOSCOPY WITH PROPOFOL
Anesthesia: General

## 2021-07-14 MED ORDER — SODIUM CHLORIDE 0.9 % IV SOLN: INTRAVENOUS | Status: DC

## 2021-07-14 MED ORDER — PROPOFOL 500 MG/50ML IV EMUL
INTRAVENOUS | Status: DC | PRN
  Administered 2021-07-14: 125 ug/kg/min via INTRAVENOUS

## 2021-07-14 MED ORDER — LIDOCAINE HCL (PF) 2 % IJ SOLN
INTRAMUSCULAR | Status: AC
  Filled 2021-07-14: qty 5

## 2021-07-14 MED ORDER — PROPOFOL 10 MG/ML IV BOLUS
INTRAVENOUS | Status: DC | PRN
  Administered 2021-07-14: 20 mg via INTRAVENOUS
  Administered 2021-07-14: 50 mg via INTRAVENOUS

## 2021-07-14 MED ORDER — PROPOFOL 500 MG/50ML IV EMUL
INTRAVENOUS | Status: AC
  Filled 2021-07-14: qty 50

## 2021-07-14 NOTE — Transfer of Care (Signed)
Immediate Anesthesia Transfer of Care Note  Patient: Carla Gill  Procedure(s) Performed: COLONOSCOPY WITH PROPOFOL  Patient Location: PACU and Endoscopy Unit  Anesthesia Type:General  Level of Consciousness: drowsy and patient cooperative  Airway & Oxygen Therapy: Patient Spontanous Breathing  Post-op Assessment: Report given to RN and Post -op Vital signs reviewed and stable  Post vital signs: Reviewed and stable  Last Vitals:  Vitals Value Taken Time  BP 116/71 07/14/21 0851  Temp    Pulse 61 07/14/21 0851  Resp 16 07/14/21 0851  SpO2 100 % 07/14/21 0851  Vitals shown include unvalidated device data.  Last Pain:  Vitals:   07/14/21 0759  TempSrc: Temporal  PainSc: 0-No pain         Complications: No notable events documented.

## 2021-07-14 NOTE — Progress Notes (Unsigned)
Medical Clearance has been received from [facility] office. The patient is cleared at Medium risk.

## 2021-07-14 NOTE — Anesthesia Preprocedure Evaluation (Signed)
Anesthesia Evaluation  Patient identified by MRN, date of birth, ID band Patient awake    Reviewed: Allergy & Precautions, NPO status , Patient's Chart, lab work & pertinent test results  History of Anesthesia Complications Negative for: history of anesthetic complications  Airway Mallampati: III  TM Distance: >3 FB Neck ROM: full    Dental  (+) Chipped   Pulmonary neg shortness of breath, asthma , sleep apnea , Current Smoker,    Pulmonary exam normal        Cardiovascular Exercise Tolerance: Good hypertension, (-) angina(-) Past MI Normal cardiovascular exam     Neuro/Psych negative neurological ROS  negative psych ROS   GI/Hepatic negative GI ROS, Neg liver ROS, neg GERD  ,  Endo/Other  diabetes, Type 2  Renal/GU negative Renal ROS  negative genitourinary   Musculoskeletal  (+) Arthritis ,   Abdominal   Peds  Hematology negative hematology ROS (+)   Anesthesia Other Findings Past Medical History: No date: Arthritis No date: Asthma     Comment:  well controlled No date: Diabetes ([place]) No date: Environmental and seasonal allergies No date: Hypertension  Past Surgical History: 1990: [place]: CHOLECYSTECTOMY No date: DILATION AND CURETTAGE OF UTERUS No date: FOOT SURGERY No date: TUBAL LIGATION     Reproductive/Obstetrics negative OB ROS                             Anesthesia Physical Anesthesia Plan  ASA: 3  Anesthesia Plan: General   Post-op Pain Management:    Induction: Intravenous  PONV Risk Score and Plan: Propofol infusion and TIVA  Airway Management Planned: Natural Airway and Nasal Cannula  Additional Equipment:   Intra-op Plan:   Post-operative Plan:   Informed Consent: I have reviewed the patients History and Physical, chart, labs and discussed the procedure including the risks, benefits and alternatives for the proposed anesthesia with  the patient or authorized representative who has indicated his/her understanding and acceptance.     Dental Advisory Given  Plan Discussed with: Anesthesiologist, CRNA and Surgeon  Anesthesia Plan Comments: (Patient consented for risks of anesthesia including but not limited to:  - adverse reactions to medications - risk of airway placement if required - damage to eyes, teeth, lips or other oral mucosa - nerve damage due to positioning  - sore throat or hoarseness - Damage to heart, brain, nerves, lungs, other parts of body or loss of life  Patient voiced understanding.)        Anesthesia Quick Evaluation

## 2021-07-14 NOTE — Op Note (Signed)
[facility] Gastroenterology Patient Name: Carla Gill Procedure Date: 07/14/2021 8:12 AM MRN: [id number] Account #: [id number] Date of Birth: 12-05-1970 Admit Type: Outpatient Age: 51 Room: [facility] ENDO ROOM 4 Gender: Female Note Status: Finalized Instrument Name: [id number] Procedure:             Colonoscopy Indications:           Chronic diarrhea Providers:             Jonathon Bellows MD, MD Referring MD:          Jon Billings (Referring MD) Medicines:             Monitored Anesthesia Care Complications:         No immediate complications. Procedure:             Pre-Anesthesia Assessment:                        - ASA Grade Assessment: II - A patient with mild                         systemic disease.                        After obtaining informed consent, the colonoscope was                         passed under direct vision. Throughout the procedure,                         the patient's blood pressure, pulse, and oxygen                         saturations were monitored continuously. The                         Colonoscope was introduced through the anus and                         advanced to the the cecum, identified by the                         appendiceal orifice. The Colonoscope was introduced                         through the anus and advanced to the the cecum,                         identified by the appendiceal orifice. The colonoscopy                         was performed with ease. The patient tolerated the                         procedure well. The quality of the bowel preparation                         was excellent. Findings:      The perianal and digital rectal examinations were normal.      Two sessile polyps were found in the transverse colon. The  polyps were 4       to 5 mm in size. These polyps were removed with a cold snare. Resection       and retrieval were complete.      Normal mucosa was found in the entire  colon. Biopsies were taken with a       cold forceps for histology.      The exam was otherwise without abnormality on direct and retroflexion       views. Impression:            - Two 4 to 5 mm polyps in the transverse colon,                         removed with a cold snare. Resected and retrieved.                        - Normal mucosa in the entire examined colon. Biopsied.                        - The examination was otherwise normal on direct and                         retroflexion views. Recommendation:        - Discharge patient to home (with escort).                        - Resume previous diet.                        - Continue present medications.                        - Await pathology results.                        - Repeat colonoscopy for surveillance based on                         pathology results.                        - Return to GI office as previously scheduled. Procedure Code(s):     --- Professional ---                        [phone], Colonoscopy, flexible; with removal of                         tumor(s), polyp(s), or other lesion(s) by snare                         technique                        45380, 54, Colonoscopy, flexible; with biopsy, single                         or multiple Diagnosis Code(s):     --- Professional ---                        K63.5, Polyp of colon  K52.9, Noninfective gastroenteritis and colitis,                         unspecified CPT copyright 2019 American Medical Association. All rights reserved. The codes documented in this report are preliminary and upon coder review may  be revised to meet current compliance requirements. Jonathon Bellows, MD Jonathon Bellows MD, MD 07/14/2021 8:49:39 AM This report has been signed electronically. Number of Addenda: 0 Note Initiated On: 07/14/2021 8:12 AM Scope Withdrawal Time: 0 hours 12 minutes 57 seconds  Total Procedure Duration: 0 hours 18 minutes 1 second  Estimated Blood  Loss:  Estimated blood loss: none.      [facility]

## 2021-07-14 NOTE — Anesthesia Postprocedure Evaluation (Signed)
Anesthesia Post Note  Patient: Harpreet Luth  Procedure(s) Performed: COLONOSCOPY WITH PROPOFOL  Patient location during evaluation: Endoscopy Anesthesia Type: General Level of consciousness: awake and alert Pain management: pain level controlled Vital Signs Assessment: post-procedure vital signs reviewed and stable Respiratory status: spontaneous breathing, nonlabored ventilation, respiratory function stable and patient connected to nasal cannula oxygen Cardiovascular status: blood pressure returned to baseline and stable Postop Assessment: no apparent nausea or vomiting Anesthetic complications: no   No notable events documented.   Last Vitals:  Vitals:   07/14/21 0910 07/14/21 0920  BP: 119/72 130/82  Pulse: 60 66  Resp: 17 18  Temp:    SpO2: 100% 100%    Last Pain:  Vitals:   07/14/21 0759  TempSrc: Temporal  PainSc: 0-No pain                 Precious Haws Cobie Marcoux

## 2021-07-14 NOTE — H&P (Signed)
Jonathon Bellows, MD [address], [place], [place], [place], [address] [id number], [place], [place], [place], [address] Phone: [phone]  Fax: [phone]  Primary Care Physician:  Jon Billings, NP   Pre-Procedure History & Physical: HPI:  Carla Gill is a 51 y.o. female is here for an colonoscopy.   Past Medical History:  Diagnosis Date   Arthritis    Asthma    well controlled   Diabetes ([place])    Environmental and seasonal allergies    Hypertension     Past Surgical History:  Procedure Laterality Date   CESAREAN SECTION  1990   CHOLECYSTECTOMY  1993   DILATION AND CURETTAGE OF UTERUS     FOOT SURGERY     TUBAL LIGATION      Prior to Admission medications   Medication Sig Start Date End Date Taking? Authorizing Provider  albuterol (VENTOLIN HFA) 108 (90 Base) MCG/ACT inhaler Inhale 1 puff into the lungs every 6 (six) hours as needed for wheezing or shortness of breath. 05/14/21  Yes Masoud, Viann Shove, MD  amLODipine (NORVASC) 5 MG tablet Take 5 mg by mouth every morning. 12/06/20  Yes [provider]  metFORMIN (GLUCOPHAGE) 500 MG tablet Take 1 tablet (500 mg total) by mouth 2 (two) times daily. 05/14/21  Yes Masoud, Viann Shove, MD  olmesartan (BENICAR) 40 MG tablet Take 1 tablet (40 mg total) by mouth daily. Patient taking differently: Take 40 mg by mouth every morning. 05/14/21  Yes Masoud, Viann Shove, MD  rosuvastatin (CRESTOR) 5 MG tablet Take 1 tablet (5 mg total) by mouth daily. Patient taking differently: Take 5 mg by mouth every morning. 06/25/21  Yes Jon Billings, NP  BLACK ELDERBERRY PO Take 1 capsule by mouth daily. With vitamin c and zinc    [provider]  diphenhydrAMINE (BENADRYL) 25 MG tablet Take 25 mg by mouth daily as needed for allergies.    [provider]  Lidocaine 4 % PTCH Apply 1 patch topically daily as needed (pain).    [provider]  Magnesium 250 MG TABS Take 250 mg by mouth daily.    [provider]  Multiple Vitamin (MULTIVITAMIN WITH MINERALS) TABS tablet Take 1 tablet by mouth daily.    [provider]  Multiple Vitamins-Minerals (HAIR/SKIN/NAILS/BIOTIN) TABS Take 2 tablets by mouth daily.    [provider]  PRESCRIPTION MEDICATION Apply 1 application topically daily as needed (pain). Gabapentin 10%  topical cream    [provider]    Allergies as of 05/20/2021 - Review Complete 05/19/2021  Allergen Reaction Noted   Penicillins  03/18/2021    Family History  Problem Relation Age of Onset   Diabetes Mother    Hypertension Mother    Heart disease Father    Diabetes Sister    Hypertension Sister    Hypertension Sister    Hypertension Sister    Hypertension Sister    Diabetes Brother    Hypertension Brother    Heart disease Brother    Mental illness Brother    Depression Brother    Diabetes Brother    Hypertension Brother    Hypertension Brother    Asthma Son    Diabetes Maternal Grandmother    Hypertension Paternal Grandmother    Hypertension Paternal Grandfather     Social History   Socioeconomic History   Marital status: Single    Spouse name: Not on file   Number of children: Not on file   Years of  education: Not on file   Highest education level: Not on file  Occupational History   Not on file  Tobacco Use   Smoking status: Some Days    Packs/day: 0.25    Years: 25.00    Pack years: 6.25    Types: Cigarettes    Last attempt to quit: 07/02/2021    Years since quitting: 0.0   Smokeless tobacco: Never  Vaping Use   Vaping Use: Never used  Substance and Sexual Activity   Alcohol use: Never   Drug use: Yes    Types: Marijuana   Sexual activity: Not Currently  Other Topics Concern   Not on file  Social History Narrative   Not on file   Social Determinants of Health   Financial Resource Strain: Not on file  Food Insecurity: Not on file  Transportation Needs: Not on file  Physical Activity: Not on file   Stress: Not on file  Social Connections: Not on file  Intimate Partner Violence: Not on file    Review of Systems: See HPI, otherwise negative ROS  Physical Exam: BP 140/83    Pulse 65    Temp (!) 96.5 F (35.8 C) (Temporal)    Resp 18    Ht 5\' 3"  (1.6 m)    Wt 95.7 kg    SpO2 100%    BMI 37.38 kg/m  General:   Alert,  pleasant and cooperative in NAD Head:  Normocephalic and atraumatic. Neck:  Supple; no masses or thyromegaly. Lungs:  Clear throughout to auscultation, normal respiratory effort.    Heart:  +S1, +S2, Regular rate and rhythm, No edema. Abdomen:  Soft, nontender and nondistended. Normal bowel sounds, without guarding, and without rebound.   Neurologic:  Alert and  oriented x4;  grossly normal neurologically.  Impression/Plan: Carla Gill is here for an colonoscopy to be performed for diarrhea  Risks, benefits, limitations, and alternatives regarding  colonoscopy have been reviewed with the patient.  Questions have been answered.  All parties agreeable.   Jonathon Bellows, MD  07/14/2021, 8:02 AM

## 2021-07-14 NOTE — Anesthesia Procedure Notes (Signed)
Procedure Name: MAC Date/Time: 07/14/2021 8:28 AM Performed by: Jerrye Noble, CRNA Pre-anesthesia Checklist: Patient identified, Emergency Drugs available, Suction available and Patient being monitored Patient Re-evaluated:Patient Re-evaluated prior to induction Oxygen Delivery Method: Nasal cannula

## 2021-07-15 LAB — SURGICAL PATHOLOGY

## 2021-07-17 ENCOUNTER — Ambulatory Visit
Admission: RE | Admit: 2021-07-17 | Discharge: 2021-07-17 | Disposition: A | Discharge status: Home / Self Care | Payer: Federal, State, Local not specified - PPO | Attending: Surgery | Admitting: Surgery

## 2021-07-17 ENCOUNTER — Ambulatory Visit: Payer: Federal, State, Local not specified - PPO | Admitting: Anesthesiology

## 2021-07-17 ENCOUNTER — Encounter
Admission: RE | Disposition: A | Discharge status: Home / Self Care | Payer: Self-pay | Source: Home / Self Care | Attending: Surgery

## 2021-07-17 ENCOUNTER — Encounter: Payer: Self-pay | Admitting: Surgery

## 2021-07-17 ENCOUNTER — Other Ambulatory Visit: Payer: Self-pay

## 2021-07-17 DIAGNOSIS — Z9049 Acquired absence of other specified parts of digestive tract: Secondary | ICD-10-CM | POA: Insufficient documentation

## 2021-07-17 DIAGNOSIS — G4733 Obstructive sleep apnea (adult) (pediatric): Secondary | ICD-10-CM | POA: Diagnosis not present

## 2021-07-17 DIAGNOSIS — K432 Incisional hernia without obstruction or gangrene: Secondary | ICD-10-CM

## 2021-07-17 DIAGNOSIS — G4730 Sleep apnea, unspecified: Secondary | ICD-10-CM | POA: Diagnosis not present

## 2021-07-17 DIAGNOSIS — E119 Type 2 diabetes mellitus without complications: Secondary | ICD-10-CM | POA: Diagnosis not present

## 2021-07-17 DIAGNOSIS — J45909 Unspecified asthma, uncomplicated: Secondary | ICD-10-CM | POA: Insufficient documentation

## 2021-07-17 DIAGNOSIS — I10 Essential (primary) hypertension: Secondary | ICD-10-CM | POA: Insufficient documentation

## 2021-07-17 DIAGNOSIS — K436 Other and unspecified ventral hernia with obstruction, without gangrene: Secondary | ICD-10-CM

## 2021-07-17 HISTORY — PX: XI ROBOTIC ASSISTED VENTRAL HERNIA: SHX6789

## 2021-07-17 LAB — GLUCOSE, CAPILLARY
Glucose-Capillary: 158 mg/dL — ABNORMAL HIGH (ref 70–99)
Glucose-Capillary: 186 mg/dL — ABNORMAL HIGH (ref 70–99)

## 2021-07-17 LAB — POCT PREGNANCY, URINE: Preg Test, Ur: NEGATIVE

## 2021-07-17 SURGERY — REPAIR, HERNIA, VENTRAL, ROBOT-ASSISTED
Anesthesia: General

## 2021-07-17 MED ORDER — OXYCODONE HCL 5 MG PO TABS: 5.0000 mg | ORAL_TABLET | Freq: Once | ORAL | Status: DC | PRN

## 2021-07-17 MED ORDER — CEFAZOLIN SODIUM-DEXTROSE 2-4 GM/100ML-% IV SOLN: 2.0000 g | INTRAVENOUS | Status: DC

## 2021-07-17 MED ORDER — SEVOFLURANE IN SOLN
RESPIRATORY_TRACT | Status: AC
  Filled 2021-07-17: qty 250

## 2021-07-17 MED ORDER — BUPIVACAINE LIPOSOME 1.3 % IJ SUSP
INTRAMUSCULAR | Status: AC
  Filled 2021-07-17: qty 20

## 2021-07-17 MED ORDER — FENTANYL CITRATE (PF) 250 MCG/5ML IJ SOLN
INTRAMUSCULAR | Status: AC
  Filled 2021-07-17: qty 5

## 2021-07-17 MED ORDER — SUCCINYLCHOLINE CHLORIDE 200 MG/10ML IV SOSY
PREFILLED_SYRINGE | INTRAVENOUS | Status: DC | PRN
  Administered 2021-07-17: 100 mg via INTRAVENOUS

## 2021-07-17 MED ORDER — OXYCODONE HCL 5 MG/5ML PO SOLN: 5.0000 mg | Freq: Once | ORAL | Status: DC | PRN

## 2021-07-17 MED ORDER — CHLORHEXIDINE GLUCONATE CLOTH 2 % EX PADS: 6.0000 | MEDICATED_PAD | Freq: Once | CUTANEOUS | Status: DC

## 2021-07-17 MED ORDER — ONDANSETRON HCL 4 MG/2ML IJ SOLN
INTRAMUSCULAR | Status: AC
  Filled 2021-07-17: qty 2

## 2021-07-17 MED ORDER — KETOROLAC TROMETHAMINE 30 MG/ML IJ SOLN
INTRAMUSCULAR | Status: AC
  Filled 2021-07-17: qty 1

## 2021-07-17 MED ORDER — IBUPROFEN 800 MG PO TABS: 800.0000 mg | ORAL_TABLET | Freq: Three times a day (TID) | ORAL | 1 refills | Status: DC | PRN

## 2021-07-17 MED ORDER — SODIUM CHLORIDE 0.9 % IV SOLN: INTRAVENOUS | Status: DC | PRN

## 2021-07-17 MED ORDER — PROPOFOL 10 MG/ML IV BOLUS
INTRAVENOUS | Status: DC | PRN
  Administered 2021-07-17: 200 mg via INTRAVENOUS

## 2021-07-17 MED ORDER — GABAPENTIN 300 MG PO CAPS: 300.0000 mg | ORAL_CAPSULE | ORAL | Status: DC

## 2021-07-17 MED ORDER — OXYCODONE HCL 5 MG PO TABS: 5.0000 mg | ORAL_TABLET | ORAL | 0 refills | Status: DC | PRN

## 2021-07-17 MED ORDER — GABAPENTIN 300 MG PO CAPS
ORAL_CAPSULE | ORAL | Status: AC
  Filled 2021-07-17: qty 1

## 2021-07-17 MED ORDER — SODIUM CHLORIDE 0.9 % IV SOLN: INTRAVENOUS | Status: DC

## 2021-07-17 MED ORDER — ROCURONIUM BROMIDE 100 MG/10ML IV SOLN
INTRAVENOUS | Status: DC | PRN
  Administered 2021-07-17: 50 mg via INTRAVENOUS
  Administered 2021-07-17: 20 mg via INTRAVENOUS

## 2021-07-17 MED ORDER — HYDROMORPHONE HCL 1 MG/ML IJ SOLN
INTRAMUSCULAR | Status: AC
  Filled 2021-07-17: qty 1

## 2021-07-17 MED ORDER — SUCCINYLCHOLINE CHLORIDE 200 MG/10ML IV SOSY
PREFILLED_SYRINGE | INTRAVENOUS | Status: AC
  Filled 2021-07-17: qty 10

## 2021-07-17 MED ORDER — PHENYLEPHRINE HCL-NACL 20-0.9 MG/250ML-% IV SOLN
INTRAVENOUS | Status: DC | PRN
  Administered 2021-07-17: 30 ug/min via INTRAVENOUS

## 2021-07-17 MED ORDER — CHLORHEXIDINE GLUCONATE 0.12 % MT SOLN
OROMUCOSAL | Status: AC
  Administered 2021-07-17: 15 mL via OROMUCOSAL
  Filled 2021-07-17: qty 15

## 2021-07-17 MED ORDER — FAMOTIDINE 20 MG PO TABS
20.0000 mg | ORAL_TABLET | Freq: Once | ORAL | Status: AC
  Administered 2021-07-17: 20 mg via ORAL

## 2021-07-17 MED ORDER — FAMOTIDINE 20 MG PO TABS
ORAL_TABLET | ORAL | Status: AC
  Administered 2021-07-17: 20 mg via ORAL
  Filled 2021-07-17: qty 1

## 2021-07-17 MED ORDER — CLINDAMYCIN PHOSPHATE 900 MG/50ML IV SOLN
900.0000 mg | Freq: Once | INTRAVENOUS | Status: AC
  Administered 2021-07-17: 900 mg via INTRAVENOUS

## 2021-07-17 MED ORDER — ACETAMINOPHEN 500 MG PO TABS
ORAL_TABLET | ORAL | Status: AC
  Administered 2021-07-17: 1000 mg via ORAL
  Filled 2021-07-17: qty 2

## 2021-07-17 MED ORDER — DEXAMETHASONE SODIUM PHOSPHATE 10 MG/ML IJ SOLN
INTRAMUSCULAR | Status: DC | PRN
  Administered 2021-07-17: 10 mg via INTRAVENOUS

## 2021-07-17 MED ORDER — GLYCOPYRROLATE 0.2 MG/ML IJ SOLN
INTRAMUSCULAR | Status: AC
  Filled 2021-07-17: qty 1

## 2021-07-17 MED ORDER — ACETAMINOPHEN 500 MG PO TABS: 1000.0000 mg | ORAL_TABLET | ORAL | Status: AC

## 2021-07-17 MED ORDER — BUPIVACAINE LIPOSOME 1.3 % IJ SUSP
INTRAMUSCULAR | Status: DC | PRN
  Administered 2021-07-17: 20 mL

## 2021-07-17 MED ORDER — KETOROLAC TROMETHAMINE 30 MG/ML IJ SOLN
INTRAMUSCULAR | Status: DC | PRN
  Administered 2021-07-17: 30 mg via INTRAVENOUS

## 2021-07-17 MED ORDER — LIDOCAINE HCL (PF) 2 % IJ SOLN
INTRAMUSCULAR | Status: AC
  Filled 2021-07-17: qty 5

## 2021-07-17 MED ORDER — BUPIVACAINE-EPINEPHRINE (PF) 0.25% -1:200000 IJ SOLN
INTRAMUSCULAR | Status: AC
  Filled 2021-07-17: qty 30

## 2021-07-17 MED ORDER — ONDANSETRON HCL 4 MG/2ML IJ SOLN
INTRAMUSCULAR | Status: DC | PRN
  Administered 2021-07-17: 4 mg via INTRAVENOUS

## 2021-07-17 MED ORDER — ACETAMINOPHEN 500 MG PO TABS: 1000.0000 mg | ORAL_TABLET | Freq: Four times a day (QID) | ORAL | Status: DC | PRN

## 2021-07-17 MED ORDER — ROCURONIUM BROMIDE 10 MG/ML (PF) SYRINGE
PREFILLED_SYRINGE | INTRAVENOUS | Status: AC
  Filled 2021-07-17: qty 10

## 2021-07-17 MED ORDER — ORAL CARE MOUTH RINSE: 15.0000 mL | Freq: Once | OROMUCOSAL | Status: AC

## 2021-07-17 MED ORDER — FENTANYL CITRATE (PF) 100 MCG/2ML IJ SOLN
INTRAMUSCULAR | Status: DC | PRN
  Administered 2021-07-17 (×2): 50 ug via INTRAVENOUS
  Administered 2021-07-17: 100 ug via INTRAVENOUS

## 2021-07-17 MED ORDER — CLINDAMYCIN PHOSPHATE 900 MG/50ML IV SOLN
INTRAVENOUS | Status: AC
  Filled 2021-07-17: qty 50

## 2021-07-17 MED ORDER — BUPIVACAINE LIPOSOME 1.3 % IJ SUSP: 20.0000 mL | Freq: Once | INTRAMUSCULAR | Status: DC

## 2021-07-17 MED ORDER — PROPOFOL 500 MG/50ML IV EMUL
INTRAVENOUS | Status: AC
  Filled 2021-07-17: qty 50

## 2021-07-17 MED ORDER — BUPIVACAINE-EPINEPHRINE 0.5% -1:200000 IJ SOLN
INTRAMUSCULAR | Status: DC | PRN
  Administered 2021-07-17: 30 mL

## 2021-07-17 MED ORDER — CHLORHEXIDINE GLUCONATE 0.12 % MT SOLN: 15.0000 mL | Freq: Once | OROMUCOSAL | Status: AC

## 2021-07-17 MED ORDER — FENTANYL CITRATE (PF) 100 MCG/2ML IJ SOLN
INTRAMUSCULAR | Status: AC
  Filled 2021-07-17: qty 2

## 2021-07-17 MED ORDER — 0.9 % SODIUM CHLORIDE (POUR BTL) OPTIME
TOPICAL | Status: DC | PRN
  Administered 2021-07-17: 100 mL

## 2021-07-17 MED ORDER — FENTANYL CITRATE (PF) 100 MCG/2ML IJ SOLN: 25.0000 ug | INTRAMUSCULAR | Status: DC | PRN

## 2021-07-17 MED ORDER — SUGAMMADEX SODIUM 500 MG/5ML IV SOLN
INTRAVENOUS | Status: AC
  Filled 2021-07-17: qty 5

## 2021-07-17 SURGICAL SUPPLY — 65 items
ADH SKN CLS APL DERMABOND .7 (GAUZE/BANDAGES/DRESSINGS) ×1
APL PRP STRL LF DISP 70% ISPRP (MISCELLANEOUS) ×1
BAG SPEC RTRVL LRG 6X4 10 (ENDOMECHANICALS) ×1
BLADE SURG SZ11 CARB STEEL (BLADE) ×2 IMPLANT
CANNULA REDUC XI 12-8 STAPL (CANNULA) ×1
CANNULA REDUCER 12-8 DVNC XI (CANNULA) ×1 IMPLANT
CHLORAPREP W/TINT 26 (MISCELLANEOUS) ×2 IMPLANT
COVER TIP SHEARS 8 DVNC (MISCELLANEOUS) ×1 IMPLANT
COVER TIP SHEARS 8MM DA VINCI (MISCELLANEOUS) ×1
DEFOGGER SCOPE WARMER CLEARIFY (MISCELLANEOUS) ×2 IMPLANT
DERMABOND ADVANCED (GAUZE/BANDAGES/DRESSINGS) ×1
DERMABOND ADVANCED .7 DNX12 (GAUZE/BANDAGES/DRESSINGS) ×1 IMPLANT
DRAPE ARM DVNC X/XI (DISPOSABLE) ×3 IMPLANT
DRAPE COLUMN DVNC XI (DISPOSABLE) ×1 IMPLANT
DRAPE DA VINCI XI ARM (DISPOSABLE) ×3
DRAPE DA VINCI XI COLUMN (DISPOSABLE) ×1
ELECT CAUTERY BLADE TIP 2.5 (TIP) ×2
ELECT REM PT RETURN 9FT ADLT (ELECTROSURGICAL) ×2
ELECTRODE CAUTERY BLDE TIP 2.5 (TIP) ×1 IMPLANT
ELECTRODE REM PT RTRN 9FT ADLT (ELECTROSURGICAL) ×1 IMPLANT
GAUZE 4X4 16PLY ~~LOC~~+RFID DBL (SPONGE) ×2 IMPLANT
GLOVE SURG SYN 7.0 (GLOVE) ×4 IMPLANT
GLOVE SURG SYN 7.0 PF PI (GLOVE) ×2 IMPLANT
GLOVE SURG SYN 7.5  E (GLOVE) ×2
GLOVE SURG SYN 7.5 E (GLOVE) ×2 IMPLANT
GLOVE SURG SYN 7.5 PF PI (GLOVE) ×2 IMPLANT
GOWN STRL REUS W/ TWL LRG LVL3 (GOWN DISPOSABLE) ×3 IMPLANT
GOWN STRL REUS W/TWL LRG LVL3 (GOWN DISPOSABLE) ×6
GRASPER SUT TROCAR 14GX15 (MISCELLANEOUS) ×2 IMPLANT
IRRIGATION STRYKERFLOW (MISCELLANEOUS) IMPLANT
IRRIGATOR STRYKERFLOW (MISCELLANEOUS)
IV NS 1000ML (IV SOLUTION)
IV NS 1000ML BAXH (IV SOLUTION) IMPLANT
KIT PINK PAD W/HEAD ARE REST (MISCELLANEOUS) ×2
KIT PINK PAD W/HEAD ARM REST (MISCELLANEOUS) ×1 IMPLANT
LABEL OR SOLS (LABEL) ×2 IMPLANT
MANIFOLD NEPTUNE II (INSTRUMENTS) ×2 IMPLANT
MESH VENTRALIGHT ST 4.5IN (Mesh General) ×1 IMPLANT
NDL INSUFFLATION 14GA 120MM (NEEDLE) ×1 IMPLANT
NEEDLE HYPO 22GX1.5 SAFETY (NEEDLE) ×2 IMPLANT
NEEDLE INSUFFLATION 14GA 120MM (NEEDLE) ×2 IMPLANT
OBTURATOR OPTICAL STANDARD 8MM (TROCAR) ×1
OBTURATOR OPTICAL STND 8 DVNC (TROCAR) ×1
OBTURATOR OPTICALSTD 8 DVNC (TROCAR) ×1 IMPLANT
PACK LAP CHOLECYSTECTOMY (MISCELLANEOUS) ×2 IMPLANT
PENCIL ELECTRO HAND CTR (MISCELLANEOUS) ×2 IMPLANT
POUCH SPECIMEN RETRIEVAL 10MM (ENDOMECHANICALS) ×1 IMPLANT
SEAL CANN UNIV 5-8 DVNC XI (MISCELLANEOUS) ×2 IMPLANT
SEAL XI 5MM-8MM UNIVERSAL (MISCELLANEOUS) ×2
SET TUBE SMOKE EVAC HIGH FLOW (TUBING) ×2 IMPLANT
SOLUTION ELECTROLUBE (MISCELLANEOUS) ×2 IMPLANT
SPONGE T-LAP 18X18 ~~LOC~~+RFID (SPONGE) ×2 IMPLANT
STAPLER CANNULA SEAL DVNC XI (STAPLE) ×1 IMPLANT
STAPLER CANNULA SEAL XI (STAPLE) ×1
SUT MNCRL 4-0 (SUTURE) ×2
SUT MNCRL 4-0 27XMFL (SUTURE) ×1
SUT STRATAFIX PDS 30 CT-1 (SUTURE) ×2 IMPLANT
SUT VIC AB 3-0 SH 27 (SUTURE) ×2
SUT VIC AB 3-0 SH 27X BRD (SUTURE) IMPLANT
SUT VICRYL 0 AB UR-6 (SUTURE) ×4 IMPLANT
SUT VLOC 90 2/L VL 12 GS22 (SUTURE) ×5 IMPLANT
SUTURE MNCRL 4-0 27XMF (SUTURE) ×1 IMPLANT
TRAY FOLEY SLVR 16FR LF STAT (SET/KITS/TRAYS/PACK) ×2 IMPLANT
WAND RF SURG SPNG DETECT SYS (INSTRUMENTS) ×2 IMPLANT
WATER STERILE IRR 500ML POUR (IV SOLUTION) ×1 IMPLANT

## 2021-07-17 NOTE — Anesthesia Postprocedure Evaluation (Signed)
Anesthesia Post Note  Patient: Carla Gill  Procedure(s) Performed: XI ROBOTIC ASSISTED VENTRAL HERNIA  Patient location during evaluation: PACU Anesthesia Type: General Level of consciousness: awake and alert Pain management: pain level controlled Vital Signs Assessment: post-procedure vital signs reviewed and stable Respiratory status: spontaneous breathing, nonlabored ventilation and respiratory function stable Cardiovascular status: blood pressure returned to baseline and stable Postop Assessment: no apparent nausea or vomiting Anesthetic complications: no   No notable events documented.   Last Vitals:  Vitals:   07/17/21 1725 07/17/21 1737  BP:  137/70  Pulse: 75 79  Resp: 17 18  Temp:  36.7 C  SpO2: 94% 100%    Last Pain:  Vitals:   07/17/21 1737  TempSrc:   PainSc: 0-No pain                 [place] Ouch

## 2021-07-17 NOTE — Anesthesia Procedure Notes (Signed)
Procedure Name: Intubation Date/Time: 07/17/2021 1:31 PM Performed by: Beverely Low, CRNA Pre-anesthesia Checklist: Patient identified, Emergency Drugs available, Suction available and Patient being monitored Patient Re-evaluated:Patient Re-evaluated prior to induction Oxygen Delivery Method: Circle system utilized Preoxygenation: Pre-oxygenation with 100% oxygen Induction Type: IV induction Ventilation: Mask ventilation without difficulty Laryngoscope Size: 4 Grade View: Grade II Tube type: Oral Tube size: 7.0 mm Number of attempts: 1 Airway Equipment and Method: Stylet and Oral airway Placement Confirmation: ETT inserted through vocal cords under direct vision, positive ETCO2 and breath sounds checked- equal and bilateral Secured at: 22 cm Tube secured with: Tape Dental Injury: Teeth and Oropharynx as per pre-operative assessment

## 2021-07-17 NOTE — Transfer of Care (Signed)
Immediate Anesthesia Transfer of Care Note  Patient: Carla Gill  Procedure(s) Performed: XI ROBOTIC ASSISTED VENTRAL HERNIA  Patient Location: PACU  Anesthesia Type:General  Level of Consciousness: drowsy  Airway & Oxygen Therapy: Patient Spontanous Breathing  Post-op Assessment: Post -op Vital signs reviewed and stable  Post vital signs: Reviewed  Last Vitals:  Vitals Value Taken Time  BP 109/63 07/17/21 1620  Temp 36.2 C 07/17/21 1620  Pulse 98 07/17/21 1625  Resp 19 07/17/21 1625  SpO2 100 % 07/17/21 1625  Vitals shown include unvalidated device data.  Last Pain:  Vitals:   07/17/21 1620  TempSrc:   PainSc: 0-No pain         Complications: No notable events documented.

## 2021-07-17 NOTE — Op Note (Addendum)
Procedure Date:  07/17/2021  Pre-operative Diagnosis:  Incarcerated incisional hernia  Post-operative Diagnosis: Incarcerated epigastric hernia, 1.7 cm.  Procedure:  Robotic assisted Incarcerated Epigastric Hernia Repair with mesh  Surgeon:  Melvyn Neth, MD  Anesthesia:  General endotracheal  Estimated Blood Loss:  15 ml  Specimens:  None  Complications:  None  Indications for Procedure:  This is a 51 y.o. female who presents with an incarcerated hernia just superior to a prior umbilical incision, thought to be an incisional hernia. The patient has had a laparoscopic cholecystectomy and a tubal ligation in the past.  The options of surgery versus observation were reviewed with the patient and/or family. The risks of bleeding, abscess or infection, recurrence of symptoms, potential for an open procedure, injury to surrounding structures, and chronic pain were all discussed with the patient and was willing to proceed.  Description of Procedure: The patient was correctly identified in the preoperative area and brought into the operating room.  The patient was placed supine with VTE prophylaxis in place.  Appropriate time-outs were performed.  Anesthesia was induced and the patient was intubated.  Appropriate antibiotics were infused.  The abdomen was prepped and draped in a sterile fashion. The patient's hernia defect was marked with a marking pen.  A Veress needle was introduced in the left upper quadrant and pneumoperitoneum was obtained with appropriate pressures.  Using Optiview technique, an 8 mm port was introduced in the left lateral abdominal wall without complications.  Then, a 12 mm port was introduced in the left upper quadrant and an 8 mm port in the left lower quadrant under direct visualization.  A ruler, a 0 Stratafix suture, and two 2-0 V-loc sutures were inserted through the 12 mm port under direct visualization.  The DaVinci platform was docked, camera targeted, and  instruments placed under direct visualization.  The patient was found to have adhesions of the omentum to the anterior abdominal wall at the level of the prior umbilical incisions. This was dissected out using cautery but there was no hernia associated with the incisions or the omentum.  The patient did have a small hernia defect about 3 cm superior to this area, making this an epigastric ventral hernia.  The patient's hernia was fully reduced and the peritoneum and preperitoneal fat were dissected and resected to allow better exposure of the hernia defect and for better mesh placement.  The hernia defect was measured to be 1.2 x 1.7 cm. The hernia defect was closed using the stratafix suture.  A 4.5 inch Bard Ventralight ST mesh was opened and the center of the mesh marked a a line drawn across the mesh.  This was inserted via the 12 mm port.  The mesh was then opened and placed in the center of the hernia defect.  The mesh was then sutured in place circumferentially and through the center of the mesh using the V-loc sutures.  All needles were then removed through the 12 mm port without complications.  The preperitoneal fat was placed in an endocatch bag through the 12 mm port.  The DaVinci platform was then undocked and instruments removed.    50 ml of Exparel solution mixed with 0.5% bupivacaine with epi was infiltrated around the mesh edges, hernia repair site, and port sites.  The 12 mm port was removed and the endocatch bag removed.  The fascia was closed under direct visualization utilizing an Endo Close technique with 0 Vicryl suture.  The 8 mm ports were removed.  The 12 mm incision was closed using 3-0 Vicryl and 4-0 Monocryl, and the other port incisions were closed with 4-0 Monocryl.  The wounds were cleaned and sealed with DermaBond.  The patient was emerged from anesthesia and extubated and brought to the recovery room for further management.  The patient tolerated the procedure well and all  counts were correct at the end of the case.   Melvyn Neth, MD

## 2021-07-17 NOTE — Interval H&P Note (Signed)
History and Physical Interval Note:  07/17/2021 12:53 PM  [name]  has presented today for surgery, with the diagnosis of incisional hernia.  The various methods of treatment have been discussed with the patient and family. After consideration of risks, benefits and other options for treatment, the patient has consented to  Procedure(s): XI ROBOTIC [place] (N/A) as a surgical intervention.  The patient's history has been reviewed, patient examined, no change in status, stable for surgery.  I have reviewed the patient's chart and labs.  Questions were answered to the patient's satisfaction.     Carla Gill

## 2021-07-17 NOTE — Anesthesia Preprocedure Evaluation (Signed)
Anesthesia Evaluation  Patient identified by MRN, date of birth, ID band Patient awake    Reviewed: Allergy & Precautions, NPO status , Patient's Chart, lab work & pertinent test results  History of Anesthesia Complications Negative for: history of anesthetic complications  Airway Mallampati: III  TM Distance: >3 FB Neck ROM: full    Dental  (+) Chipped   Pulmonary neg shortness of breath, asthma , sleep apnea , Current Smoker and Patient abstained from smoking.,    Pulmonary exam normal        Cardiovascular Exercise Tolerance: Good hypertension, (-) angina(-) Past MI and (-) DOE Normal cardiovascular exam     Neuro/Psych negative neurological ROS  negative psych ROS   GI/Hepatic negative GI ROS, Neg liver ROS, neg GERD  ,  Endo/Other  diabetes, Type 2  Renal/GU      Musculoskeletal  (+) Arthritis ,   Abdominal   Peds  Hematology negative hematology ROS (+)   Anesthesia Other Findings Past Medical History: No date: Arthritis No date: Asthma     Comment:  well controlled No date: Diabetes ([place]) No date: Environmental and seasonal allergies No date: Hypertension  Past Surgical History: 1990: CESAREAN SECTION 1993: CHOLECYSTECTOMY 07/14/2021: COLONOSCOPY WITH PROPOFOL; N/A     Comment:  Procedure: COLONOSCOPY WITH PROPOFOL;  Surgeon: Jonathon Bellows, MD;  [facility] ENDOSCOPY;  Service:               Gastroenterology;  Laterality: N/A; No date: DILATION AND CURETTAGE OF UTERUS No date: FOOT SURGERY No date: TUBAL LIGATION  BMI    Body Mass Index: 37.38 kg/m      Reproductive/Obstetrics negative OB ROS                             Anesthesia Physical Anesthesia Plan  ASA: 3  Anesthesia Plan: General ETT   Post-op Pain Management:    Induction: Intravenous  PONV Risk Score and Plan: Ondansetron, Dexamethasone, Midazolam and Treatment may vary due to age or  medical condition  Airway Management Planned: Oral ETT  Additional Equipment:   Intra-op Plan:   Post-operative Plan: Extubation in OR  Informed Consent: I have reviewed the patients History and Physical, chart, labs and discussed the procedure including the risks, benefits and alternatives for the proposed anesthesia with the patient or authorized representative who has indicated his/her understanding and acceptance.     Dental Advisory Given  Plan Discussed with: Anesthesiologist, CRNA and Surgeon  Anesthesia Plan Comments: (Patient consented for risks of anesthesia including but not limited to:  - adverse reactions to medications - damage to eyes, teeth, lips or other oral mucosa - nerve damage due to positioning  - sore throat or hoarseness - Damage to heart, brain, nerves, lungs, other parts of body or loss of life  Patient voiced understanding.)        Anesthesia Quick Evaluation

## 2021-07-18 ENCOUNTER — Encounter: Payer: Self-pay | Admitting: Surgery

## 2021-07-23 ENCOUNTER — Encounter: Payer: Self-pay | Admitting: Gastroenterology

## 2021-08-01 ENCOUNTER — Ambulatory Visit (INDEPENDENT_AMBULATORY_CARE_PROVIDER_SITE_OTHER): Payer: Federal, State, Local not specified - PPO | Admitting: Surgery

## 2021-08-01 ENCOUNTER — Other Ambulatory Visit: Payer: Self-pay

## 2021-08-01 ENCOUNTER — Encounter: Payer: Self-pay | Admitting: Surgery

## 2021-08-01 VITALS — BP 132/85 | HR 62 | Temp 97.8°F | Ht 62.6 in | Wt 208.0 lb

## 2021-08-01 DIAGNOSIS — Z09 Encounter for follow-up examination after completed treatment for conditions other than malignant neoplasm: Secondary | ICD-10-CM | POA: Diagnosis not present

## 2021-08-01 DIAGNOSIS — K436 Other and unspecified ventral hernia with obstruction, without gangrene: Secondary | ICD-10-CM

## 2021-08-01 NOTE — Patient Instructions (Addendum)
We will have you follow up here 4 weeks.  You may try using a wrap or abdominal binder to help with support.  GENERAL POST-OPERATIVE PATIENT INSTRUCTIONS   WOUND CARE INSTRUCTIONS: Try to keep the wound dry and avoid ointments on the wound unless directed to do so.  If the wound becomes bright red and painful or starts to drain infected material that is not clear, please contact your physician immediately.  If the wound is mildly pink and has a thick firm ridge underneath it, this is normal, and is referred to as a healing ridge.  This will resolve over the next 4-6 weeks.  BATHING: You may shower if you have been informed of this by your surgeon. However, Please do not submerge in a tub, hot tub, or pool until incisions are completely sealed or have been told by your surgeon that you may do so.  DIET:  You may eat any foods that you can tolerate.  It is a good idea to eat a high fiber diet and take in plenty of fluids to prevent constipation.  If you do become constipated you may want to take a mild laxative or take ducolax tablets on a daily basis until your bowel habits are regular.  Constipation can be very uncomfortable, along with straining, after recent surgery.  ACTIVITY:  You may want to hug a pillow when coughing and sneezing to add additional support to the surgical area, if you had abdominal or chest surgery, which will decrease pain during these times.  You are encouraged to walk and engage in light activity for the next two weeks.  You should not lift more than 20 pounds for 6 weeks after surgery as it could put you at increased risk for complications.  Twenty pounds is roughly equivalent to a plastic bag of groceries. At that time- Listen to your body when lifting, if you have pain when lifting, stop and then try again in a few days. Soreness after doing exercises or activities of daily living is normal as you get back in to your normal routine.  MEDICATIONS:  Try to take narcotic  medications and anti-inflammatory medications, such as tylenol, ibuprofen, naprosyn, etc., with food.  This will minimize stomach upset from the medication.  Should you develop nausea and vomiting from the pain medication, or develop a rash, please discontinue the medication and contact your physician.  You should not drive, make important decisions, or operate machinery when taking narcotic pain medication.  SUNBLOCK Use sun block to incision area over the next year if this area will be exposed to sun. This helps decrease scarring and will allow you avoid a permanent darkened area over your incision.  QUESTIONS:  Please feel free to call our office if you have any questions, and we will be glad to assist you.

## 2021-08-01 NOTE — Progress Notes (Signed)
08/01/2021  History of Present Illness: Carla Gill is a 51 y.o. female status post robotic assisted incarcerated epigastric hernia repair with mesh on 07/17/2021.  Patient presents today for follow-up.  Patient reports that she has been doing overall well but still has some increased discomfort at the incisions in the epigastric area.  She has not been taking much pain medication because she does not like taking medicines so it appears that she has been powering through some of the discomfort.  Denies any nausea, vomiting, constipation, fevers, chills, chest pain, shortness of breath.  Past Medical History: Past Medical History:  Diagnosis Date   Arthritis    Asthma    well controlled   Diabetes ([place])    Environmental and seasonal allergies    Hypertension      Past Surgical History: Past Surgical History:  Procedure Laterality Date   [place]   COLONOSCOPY WITH PROPOFOL N/A 07/14/2021   Procedure: COLONOSCOPY WITH PROPOFOL;  Surgeon: Jonathon Bellows, MD;  Location: [facility] ENDOSCOPY;  Service: Gastroenterology;  Laterality: N/A;   DILATION AND CURETTAGE OF UTERUS     FOOT SURGERY     TUBAL LIGATION     XI ROBOTIC ASSISTED VENTRAL HERNIA N/A 07/17/2021   Procedure: XI ROBOTIC ASSISTED VENTRAL HERNIA;  Surgeon: Olean Ree, MD;  Location: [facility] ORS;  Service: General;  Laterality: N/A;    Home Medications: Prior to Admission medications   Medication Sig Start Date End Date Taking? Authorizing Provider  acetaminophen (TYLENOL) 500 MG tablet Take 2 tablets (1,000 mg total) by mouth every 6 (six) hours as needed for mild pain. 07/17/21  Yes Huel Centola, Jacqulyn Bath, MD  albuterol (VENTOLIN HFA) 108 (90 Base) MCG/ACT inhaler Inhale 1 puff into the lungs every 6 (six) hours as needed for wheezing or shortness of breath. 05/14/21  Yes Masoud, Viann Shove, MD  amLODipine (NORVASC) 5 MG tablet Take 5 mg by mouth every morning. 12/06/20  Yes [provider]  BLACK  ELDERBERRY PO Take 1 capsule by mouth daily. With vitamin c and zinc   Yes [provider]  diphenhydrAMINE (BENADRYL) 25 MG tablet Take 25 mg by mouth daily as needed for allergies.   Yes [provider]  ibuprofen (ADVIL) 800 MG tablet Take 1 tablet (800 mg total) by mouth every 8 (eight) hours as needed for moderate pain. 07/17/21  Yes Taeveon Keesling, Jacqulyn Bath, MD  Lidocaine 4 % PTCH Apply 1 patch topically daily as needed (pain).   Yes [provider]  Magnesium 250 MG TABS Take 250 mg by mouth daily.   Yes [provider]  metFORMIN (GLUCOPHAGE) 500 MG tablet Take 1 tablet (500 mg total) by mouth 2 (two) times daily. 05/14/21  Yes Masoud, Viann Shove, MD  Multiple Vitamin (MULTIVITAMIN WITH MINERALS) TABS tablet Take 1 tablet by mouth daily.   Yes [provider]  Multiple Vitamins-Minerals (HAIR/SKIN/NAILS/BIOTIN) TABS Take 2 tablets by mouth daily.   Yes [provider]  olmesartan (BENICAR) 40 MG tablet Take 1 tablet (40 mg total) by mouth daily. Patient taking differently: Take 40 mg by mouth every morning. 05/14/21  Yes Cletis Athens, MD  PRESCRIPTION MEDICATION Apply 1 application topically daily as needed (pain). Gabapentin 10%  topical cream   Yes [provider]  rosuvastatin (CRESTOR) 5 MG tablet Take 1 tablet (5 mg total) by mouth daily. Patient taking differently: Take 5 mg by mouth every morning. 06/25/21  Yes Jon Billings, NP    Allergies: Allergies  Allergen Reactions   Penicillins Hives    Review of Systems: Review of Systems  Constitutional:  Negative for chills and fever.  Respiratory:  Negative for shortness of breath.   Cardiovascular:  Negative for chest pain.  Gastrointestinal:  Positive for abdominal pain. Negative for constipation, diarrhea, nausea and vomiting.   Physical Exam BP 132/85    Pulse 62    Temp 97.8 F (36.6 C)    Ht 5' 2.6" (1.59 m)    Wt 208 lb (94.3 kg)    LMP 06/03/2021 (Approximate)    SpO2  97%    BMI 37.32 kg/m  CONSTITUTIONAL: No acute distress, well-nourished HEENT:  Normocephalic, atraumatic, extraocular motion intact. RESPIRATORY:  Lungs are clear, and breath sounds are equal bilaterally. Normal respiratory effort without pathologic use of accessory muscles. CARDIOVASCULAR: Heart is regular without murmurs, gallops, or rubs. GI: The abdomen is soft, nondistended, with appropriate tenderness to palpation at the incisions in the epigastric area.  The incisions overall are healing well and are clean, dry, intact.  No evidence of wound infection.  On exam, there is no evidence of recurrent hernia.  There is some mild swelling and firmness at the center of the hernia consistent with scar tissue and residual hernia sac.  NEUROLOGIC:  Motor and sensation is grossly normal.  Cranial nerves are grossly intact. PSYCH:  Alert and oriented to person, place and time. Affect is normal.   Assessment and Plan: This is a 51 y.o. female status post robotic assisted and incarcerated epigastric hernia repair on 07/17/2021.  - Discussed with the patient that overall she is healing well with no evident complications at this point.  Discussed that the soreness and firmness is not uncommon after surgery due to the inflammatory changes and scarring that happens.  The area of swelling that she is feeling at the epigastric area is related to the hernia sac and likely mild fluid and scar tissue in that area.  This will continue to improve on its own.  Encouraged her to take Tylenol or ibuprofen as needed for pain and that she does not have to power through discomfort. - We will follow-up with the patient in 4 more weeks to reassess her progress.  This will allow [place] to clear her prior to returning to work.  I spent 15 minutes dedicated to the care of this patient on the date of this encounter to include pre-visit review of records, face-to-face time with the patient discussing diagnosis and management, and any  post-visit coordination of care.   Melvyn Neth, [facility]

## 2021-08-04 ENCOUNTER — Telehealth: Payer: Self-pay | Admitting: *Deleted

## 2021-08-04 NOTE — Telephone Encounter (Signed)
FMLAs are completed are up front to be picked up by patient.

## 2021-08-08 ENCOUNTER — Encounter: Payer: Self-pay | Admitting: Nurse Practitioner

## 2021-08-18 ENCOUNTER — Other Ambulatory Visit: Payer: Self-pay

## 2021-08-27 ENCOUNTER — Encounter: Payer: Self-pay | Admitting: Surgery

## 2021-08-27 ENCOUNTER — Other Ambulatory Visit: Payer: Self-pay

## 2021-08-27 ENCOUNTER — Ambulatory Visit (INDEPENDENT_AMBULATORY_CARE_PROVIDER_SITE_OTHER): Payer: Federal, State, Local not specified - PPO | Admitting: Surgery

## 2021-08-27 VITALS — BP 111/71 | HR 78 | Temp 97.8°F | Ht 62.6 in | Wt 213.8 lb

## 2021-08-27 DIAGNOSIS — K436 Other and unspecified ventral hernia with obstruction, without gangrene: Secondary | ICD-10-CM | POA: Diagnosis not present

## 2021-08-27 DIAGNOSIS — Z09 Encounter for follow-up examination after completed treatment for conditions other than malignant neoplasm: Secondary | ICD-10-CM

## 2021-08-27 NOTE — Progress Notes (Signed)
08/27/2021  History of Present Illness: Carla Gill is a 51 y.o. female s/p robotic assisted incarcerated epigastric hernia repair on 07/17/21.  She presents today for follow up.  Reports that the discomfort she was having on the last visit has been improving.  She still has some discomfort at the umbilicus and LUQ incision but are more in the morning when waking up or depending on how she moves.  Has been more intermittent.  Past Medical History: Past Medical History:  Diagnosis Date   Arthritis    Asthma    well controlled   Diabetes ([place])    Environmental and seasonal allergies    Hypertension      Past Surgical History: Past Surgical History:  Procedure Laterality Date   [place]   COLONOSCOPY WITH PROPOFOL N/A 07/14/2021   Procedure: COLONOSCOPY WITH PROPOFOL;  Surgeon: Jonathon Bellows, MD;  Location: [facility] ENDOSCOPY;  Service: Gastroenterology;  Laterality: N/A;   DILATION AND CURETTAGE OF UTERUS     FOOT SURGERY     TUBAL LIGATION     XI ROBOTIC ASSISTED VENTRAL HERNIA N/A 07/17/2021   Procedure: XI ROBOTIC ASSISTED VENTRAL HERNIA;  Surgeon: Olean Ree, MD;  Location: [facility] ORS;  Service: General;  Laterality: N/A;    Home Medications: Prior to Admission medications   Medication Sig Start Date End Date Taking? Authorizing Provider  acetaminophen (TYLENOL) 500 MG tablet Take 2 tablets (1,000 mg total) by mouth every 6 (six) hours as needed for mild pain. 07/17/21  Yes Yordy Matton, Jacqulyn Bath, MD  albuterol (VENTOLIN HFA) 108 (90 Base) MCG/ACT inhaler Inhale 1 puff into the lungs every 6 (six) hours as needed for wheezing or shortness of breath. 05/14/21  Yes Masoud, Viann Shove, MD  amLODipine (NORVASC) 5 MG tablet Take 5 mg by mouth every morning. 12/06/20  Yes [provider]  BLACK ELDERBERRY PO Take 1 capsule by mouth daily. With vitamin c and zinc   Yes [provider]  diphenhydrAMINE (BENADRYL) 25 MG tablet Take 25 mg by mouth daily  as needed for allergies.   Yes [provider]  ibuprofen (ADVIL) 800 MG tablet Take 1 tablet (800 mg total) by mouth every 8 (eight) hours as needed for moderate pain. 07/17/21  Yes Germaine Ripp, Jacqulyn Bath, MD  Lidocaine 4 % PTCH Apply 1 patch topically daily as needed (pain).   Yes [provider]  Magnesium 250 MG TABS Take 250 mg by mouth daily.   Yes [provider]  metFORMIN (GLUCOPHAGE) 500 MG tablet Take 1 tablet (500 mg total) by mouth 2 (two) times daily. 05/14/21  Yes Masoud, Viann Shove, MD  Multiple Vitamin (MULTIVITAMIN WITH MINERALS) TABS tablet Take 1 tablet by mouth daily.   Yes [provider]  Multiple Vitamins-Minerals (HAIR/SKIN/NAILS/BIOTIN) TABS Take 2 tablets by mouth daily.   Yes [provider]  olmesartan (BENICAR) 40 MG tablet Take 1 tablet (40 mg total) by mouth daily. Patient taking differently: Take 40 mg by mouth every morning. 05/14/21  Yes Cletis Athens, MD  PRESCRIPTION MEDICATION Apply 1 application topically daily as needed (pain). Gabapentin 10%  topical cream   Yes [provider]  rosuvastatin (CRESTOR) 5 MG tablet Take 1 tablet (5 mg total) by mouth daily. Patient taking differently: Take 5 mg by mouth every morning. 06/25/21  Yes Jon Billings, NP    Allergies: Allergies  Allergen Reactions   Penicillins Hives    Review of Systems: Review of Systems  Constitutional:  Negative  for chills and fever.  Respiratory:  Negative for shortness of breath.   Cardiovascular:  Negative for chest pain.  Gastrointestinal:  Positive for abdominal pain (mild discomfort at incision/hernia). Negative for nausea and vomiting.   Physical Exam BP 111/71    Pulse 78    Temp 97.8 F (36.6 C)    Ht 5' 2.6" (1.59 m)    Wt 213 lb 12.8 oz (97 kg)    LMP 05/29/2021 (Approximate)    SpO2 99%    BMI 38.36 kg/m  CONSTITUTIONAL: No acute distress HEENT:  Normocephalic, atraumatic, extraocular motion intact. RESPIRATORY:  Normal  respiratory effort without pathologic use of accessory muscles. CARDIOVASCULAR: Regular rhythm and rate. GI: The abdomen is soft, non-distended, non-tender to palpation on exam.  Incisions are well healed, without any evidence of hernia recurrence.  The hernia sac still has some firmness but this has decreased in size compared to her last visit.  NEUROLOGIC:  Motor and sensation is grossly normal.  Cranial nerves are grossly intact. PSYCH:  Alert and oriented to person, place and time. Affect is normal.   Assessment and Plan: This is a 51 y.o. female  s/p robotic assisted incarcerated epigastric hernia repair.   --Discussed with the patient that she continues to improve and there's no evidence of hernia recurrence.  Her symptoms should continue to improve and the hernia sac area should continue to soften.   --She's now 6 weeks out of surgery tomorrow, and may return to work without any restrictions. --Follow up as needed.  I spent 25 minutes dedicated to the care of this patient on the date of this encounter to include pre-visit review of records, face-to-face time with the patient discussing diagnosis and management, and any post-visit coordination of care.   Melvyn Neth, [facility]

## 2021-08-27 NOTE — Progress Notes (Deleted)
08/27/2021  HPI: Carla Gill is a 51 y.o. female s/p robotic assisted incarcerated epigastric hernia repair on 07/17/21.  She presents today for follow up.  Reports that the discomfort she was having on the last visit has been improving.  She still has some discomfort at the umbilicus and LUQ incision but are more in the morning when waking up or depending on how she moves.  Has been more intermittent.  Vital signs: BP 111/71    Pulse 78    Temp 97.8 F (36.6 C)    Ht 5' 2.6" (1.59 m)    Wt 213 lb 12.8 oz (97 kg)    LMP 05/29/2021 (Approximate)    SpO2 99%    BMI 38.36 kg/m    Physical Exam: Constitutional:  No acute distress Abdomen:  soft, non-distended, non-tender to palpation on exam.  Incisions are well healed, without any evidence of hernia recurrence.  The hernia sac still has some firmness but this has decreased in size compared to her last visit.  Assessment/Plan: This is a 51 y.o. female s/p robotic assisted incarcerated epigastric hernia repair.  --Discussed with the patient that she continues to improve and there's no evidence of hernia recurrence.  Her symptoms should continue to improve and the hernia sac area should continue to soften.   --She's now 6 weeks out of surgery tomorrow, and may return to work without any restrictions. --Follow up as needed.   Melvyn Neth, [facility]

## 2021-08-27 NOTE — Patient Instructions (Addendum)
The pinching sensation should continue to improve.   After February 22nd you are cleared to resume full activities with out restrictions.    Follow-up with our office as needed.  Please call and ask to speak with a nurse if you develop questions or concerns.

## 2021-09-17 ENCOUNTER — Other Ambulatory Visit: Payer: Self-pay

## 2021-09-17 ENCOUNTER — Encounter: Payer: Self-pay | Admitting: Emergency Medicine

## 2021-09-17 ENCOUNTER — Emergency Department
Admission: EM | Admit: 2021-09-17 | Discharge: 2021-09-17 | Disposition: A | Discharge status: Home / Self Care | Payer: Federal, State, Local not specified - PPO | Attending: Emergency Medicine | Admitting: Emergency Medicine

## 2021-09-17 ENCOUNTER — Emergency Department: Payer: Federal, State, Local not specified - PPO

## 2021-09-17 DIAGNOSIS — X503XXA Overexertion from repetitive movements, initial encounter: Secondary | ICD-10-CM | POA: Diagnosis not present

## 2021-09-17 DIAGNOSIS — Y990 Civilian activity done for income or pay: Secondary | ICD-10-CM | POA: Diagnosis not present

## 2021-09-17 DIAGNOSIS — G8911 Acute pain due to trauma: Secondary | ICD-10-CM | POA: Diagnosis not present

## 2021-09-17 DIAGNOSIS — M25511 Pain in right shoulder: Secondary | ICD-10-CM | POA: Insufficient documentation

## 2021-09-17 MED ORDER — HYDROCODONE-ACETAMINOPHEN 5-325 MG PO TABS: 1.0000 | ORAL_TABLET | Freq: Four times a day (QID) | ORAL | 0 refills | Status: DC | PRN

## 2021-09-17 MED ORDER — PREDNISONE 10 MG PO TABS: ORAL_TABLET | ORAL | 0 refills | Status: DC

## 2021-09-17 NOTE — ED Provider Notes (Signed)
? ?[facility] ?Provider Note ? ? ? Event Date/Time  ? First MD Initiated Contact with Patient 09/17/21 1344   ?  (approximate) ? ? ?History  ? ?Shoulder Pain ? ? ?HPI ? ?Carla Gill is a 51 y.o. female   presents to the ED with complaint of right shoulder pain that started approximately 1 week ago.  Patient states that she was at work when this happened and reports she does repetitive movement.  Patient has been taking ibuprofen without any relief.  She also has an appointment with an orthopedist to the end of this month but states that the pain is keeping her awake at night.  Patient has history of diabetes which is controlled with oral medication and diet.  She also has a history of arthritis, asthma, hypertension and seasonal allergies.  Currently she rates her pain as a 10/10. ?  ? ? ?Physical Exam  ? ?Triage Vital Signs: ?ED Triage Vitals  ?Enc Vitals Group  ?   BP 09/17/21 1248 121/77  ?   Pulse Rate 09/17/21 1248 81  ?   Resp 09/17/21 1248 18  ?   Temp 09/17/21 1248 98.2 ?F (36.8 ?C)  ?   Temp Source 09/17/21 1248 Oral  ?   SpO2 09/17/21 1248 99 %  ?   Weight 09/17/21 1238 213 lb 12.8 oz (97 kg)  ?   Height 09/17/21 1238 5' 2.5" (1.588 m)  ?   Head Circumference --   ?   Peak Flow --   ?   Pain Score 09/17/21 1238 10  ?   Pain Loc --   ?   Pain Edu? --   ?   Excl. in [place]? --   ? ? ?Most recent vital signs: ?Vitals:  ? 09/17/21 1248  ?BP: 121/77  ?Pulse: 81  ?Resp: 18  ?Temp: 98.2 ?F (36.8 ?C)  ?SpO2: 99%  ? ? ? ?General: Awake, no distress.  ?CV:  Good peripheral perfusion.  Heart regular rate and rhythm. ?Resp:  Normal effort.  Lungs are clear bilaterally. ?Abd:  No distention.  ?Other:  On examination of the right shoulder there is no gross deformity.  There is tenderness on palpation of the anterior AC joint.  Range of motion is slow but without crepitus.  No increased pain with abduction.  No evidence to suggest a rotator cuff tear.  No edema, warmth or discoloration noted.   Radial pulse distally is present, good grip strength. ? ? ?ED Results / Procedures / Treatments  ? ?Labs ?(all labs ordered are listed, but only abnormal results are displayed) ?Labs Reviewed - No data to display ? ? ? ?RADIOLOGY ? ?Shoulder images were reviewed by myself and no acute bony injury was noted.  Radiology report was negative. ? ? ?PROCEDURES: ? ?Critical Care performed:  ? ?Procedures ? ? ?MEDICATIONS ORDERED IN ED: ?Medications - No data to display ? ? ?IMPRESSION / MDM / ASSESSMENT AND PLAN / ED COURSE  ?I reviewed the triage vital signs and the nursing notes. ? ? ?Differential diagnosis includes, but is not limited to, right shoulder strain, osteoarthritis right shoulder, shoulder pain, bursitis, tendinitis. ? ?51 year old female presents to the ED with complaint of right shoulder pain for approximately 1 week.  No injury has occurred but patient states she does repetitive work.  She has tried ibuprofen without any relief.  No gross deformity was noted on exam however patient is moderately tender to the anterior AC joint region.  X-rays were reviewed and negative for acute bony injury.  Because patient is already taken ibuprofen without any relief and her blood sugars are in the 120s and has good control will prescribe a trial of prednisone tapering dose.  Patient understands that this will cause her blood sugar to elevate temporarily while she is on the medication.  Also prescription for hydrocodone was sent to the pharmacy as needed for moderate to severe pain and she is encouraged to keep her appointment with the orthopedist. ? ? ?FINAL CLINICAL IMPRESSION(S) / ED DIAGNOSES  ? ?Final diagnoses:  ?Acute pain of right shoulder  ? ? ? ?Rx / DC Orders  ? ?ED Discharge Orders   ? ?      Ordered  ?  predniSONE (DELTASONE) 10 MG tablet       ? 09/17/21 1446  ?  HYDROcodone-acetaminophen (NORCO/VICODIN) 5-325 MG tablet  Every 6 hours PRN       ? 09/17/21 1448  ? ?  ?  ? ?  ? ? ? ?Note:  This document was  prepared using Dragon voice recognition software and may include unintentional dictation errors. ?  ?Johnn Hai, PA-C ?09/17/21 1458 ? ?  ?Vanessa Corinth, MD ?09/17/21 1858 ? ?

## 2021-09-17 NOTE — Discharge Instructions (Signed)
A prescription for prednisone and hydrocodone was sent to the pharmacy for your shoulder pain.  Be aware that the hydrocodone could cause drowsiness and increase your risk for injury.  Do not drive or operate machinery while taking the pain medication.  The prednisone as we discussed can elevate your blood sugar which is temporary.  Continue to eat your regular diet.  Use moist heat as needed and keep your appointment with the orthopedist. ?

## 2021-09-17 NOTE — ED Triage Notes (Signed)
Pt comes into the ED via POV c/o right shoulder pain that started at work.  Pt denies that she is filing as workers comp.  Pt denies any known injury but admits to repetitive motions with the use of that arm.  ?

## 2021-09-22 ENCOUNTER — Encounter: Payer: Self-pay | Admitting: Nurse Practitioner

## 2021-09-22 ENCOUNTER — Other Ambulatory Visit: Payer: Self-pay

## 2021-09-22 ENCOUNTER — Ambulatory Visit (INDEPENDENT_AMBULATORY_CARE_PROVIDER_SITE_OTHER): Payer: Federal, State, Local not specified - PPO | Admitting: Nurse Practitioner

## 2021-09-22 VITALS — BP 115/63 | HR 66 | Temp 98.5°F | Ht 62.6 in | Wt 211.0 lb

## 2021-09-22 DIAGNOSIS — Z136 Encounter for screening for cardiovascular disorders: Secondary | ICD-10-CM | POA: Diagnosis not present

## 2021-09-22 DIAGNOSIS — Z6841 Body Mass Index (BMI) 40.0 and over, adult: Secondary | ICD-10-CM

## 2021-09-22 DIAGNOSIS — Z114 Encounter for screening for human immunodeficiency virus [HIV]: Secondary | ICD-10-CM | POA: Diagnosis not present

## 2021-09-22 DIAGNOSIS — Z0000 Encounter for general adult medical examination without abnormal findings: Secondary | ICD-10-CM

## 2021-09-22 DIAGNOSIS — Z23 Encounter for immunization: Secondary | ICD-10-CM | POA: Diagnosis not present

## 2021-09-22 DIAGNOSIS — E118 Type 2 diabetes mellitus with unspecified complications: Secondary | ICD-10-CM

## 2021-09-22 DIAGNOSIS — Z1159 Encounter for screening for other viral diseases: Secondary | ICD-10-CM

## 2021-09-22 DIAGNOSIS — I10 Essential (primary) hypertension: Secondary | ICD-10-CM | POA: Diagnosis not present

## 2021-09-22 LAB — URINALYSIS, ROUTINE W REFLEX MICROSCOPIC
Bilirubin, UA: NEGATIVE
Glucose, UA: NEGATIVE
Ketones, UA: NEGATIVE
Leukocytes,UA: NEGATIVE
Nitrite, UA: NEGATIVE
Protein,UA: NEGATIVE
RBC, UA: NEGATIVE
Specific Gravity, UA: 1.025 (ref 1.005–1.030)
Urobilinogen, Ur: 0.2 mg/dL (ref 0.2–1.0)
pH, UA: 6 (ref 5.0–7.5)

## 2021-09-22 NOTE — Assessment & Plan Note (Addendum)
Chronic.  Controlled.  Continue with current medication regimen of Metformin '500mg'$  BID.  Can add SGLT2 if A1c is elevated.  Patient has had diarrhea with increased doses of Metformin.  Labs ordered today.  Return to clinic in 3 months for reevaluation.  Call sooner if concerns arise.   ? ?

## 2021-09-22 NOTE — Progress Notes (Signed)
? ?BP 115/63   Pulse 66   Temp 98.5 ?F (36.9 ?C) (Oral)   Ht 5' 2.6" (1.59 m)   Wt 211 lb (95.7 kg)   LMP 09/08/2021 (Approximate)   SpO2 97%   BMI 37.86 kg/m?   ? ?Subjective:  ? ? Patient ID: Carla Gill, female    DOB: Apr 28, 1971, 51 y.o.   MRN: [id number] ? ?HPI: ?Carla Gill is a 51 y.o. female presenting on 09/22/2021 for comprehensive medical examination. Current medical complaints include: shoulder pain - has an appointment with Ortho on Wednesday ? ?She currently lives with: ?Menopausal Symptoms: no ? ?HYPERTENSION ?Hypertension status: controlled  ?Satisfied with current treatment? yes ?Duration of hypertension: years ?BP monitoring frequency:  daily ?BP range: 120-130/80 ?BP medication side effects:  no ?Medication compliance: excellent compliance ?Previous BP meds:amlodipine and olmesartan (benicar) ?Aspirin: no ?Recurrent headaches: no ?Visual changes: no ?Palpitations: no ?Dyspnea: no ?Chest pain: no ?Lower extremity edema: no ?Dizzy/lightheaded: no ? ?DIABETES ?Hypoglycemic episodes:no ?Polydipsia/polyuria:  every 2 hours at night she is having to go to the bathroom ?Visual disturbance: no ?Chest pain: no ?Paresthesias: no ?Glucose Monitoring: yes ? Accucheck frequency: Daily ? Fasting glucose:  ? Post prandial: ? Evening: ? Before meals: ?Taking Insulin?: no ? Long acting insulin: ? Short acting insulin: ?Blood Pressure Monitoring: daily ?Retinal Examination: Up to Date ?Foot Exam:  Up to date ?Diabetic Education: Not Completed ?Pneumovax: Up to Date ?Influenza: Up to Date ?Aspirin: no ? ?Depression Screen done today and results listed below:  ?Depression screen [facility] 2/9 09/22/2021 06/24/2021  ?Decreased Interest 0 0  ?Down, Depressed, Hopeless 0 0  ?PHQ - 2 Score 0 0  ?Altered sleeping 0 2  ?Tired, decreased energy 0 2  ?Change in appetite 0 0  ?Feeling bad or failure about yourself  0 0  ?Trouble concentrating 0 0  ?Moving slowly or fidgety/restless 0 0  ?Suicidal thoughts 0 0  ?PHQ-9  Score 0 4  ?Difficult doing work/chores Not difficult at all Not difficult at all  ? ? ?The patient does not have a history of falls. I did complete a risk assessment for falls. A plan of care for falls was documented. ? ? ?Past Medical History:  ?Past Medical History:  ?Diagnosis Date  ? Arthritis   ? Asthma   ? well controlled  ? Diabetes ([place])   ? Environmental and seasonal allergies   ? Hypertension   ? ? ?Surgical History:  ?Past Surgical History:  ?Procedure Laterality Date  ? Secretary  ? CHOLECYSTECTOMY  1993  ? COLONOSCOPY WITH PROPOFOL N/A 07/14/2021  ? Procedure: COLONOSCOPY WITH PROPOFOL;  Surgeon: Jonathon Bellows, MD;  Location: [facility] ENDOSCOPY;  Service: Gastroenterology;  Laterality: N/A;  ? DILATION AND CURETTAGE OF UTERUS    ? FOOT SURGERY    ? TUBAL LIGATION    ? XI ROBOTIC ASSISTED VENTRAL HERNIA N/A 07/17/2021  ? Procedure: XI ROBOTIC ASSISTED VENTRAL HERNIA;  Surgeon: Olean Ree, MD;  Location: [facility] ORS;  Service: General;  Laterality: N/A;  ? ? ?Medications:  ?Current Outpatient Medications on File Prior to Visit  ?Medication Sig  ? acetaminophen (TYLENOL) 500 MG tablet Take 2 tablets (1,000 mg total) by mouth every 6 (six) hours as needed for mild pain.  ? albuterol (VENTOLIN HFA) 108 (90 Base) MCG/ACT inhaler Inhale 1 puff into the lungs every 6 (six) hours as needed for wheezing or shortness of breath.  ? amLODipine (NORVASC) 5 MG tablet Take 5 mg by mouth every  morning.  ? diphenhydrAMINE (BENADRYL) 25 MG tablet Take 25 mg by mouth daily as needed for allergies.  ? HYDROcodone-acetaminophen (NORCO/VICODIN) 5-325 MG tablet Take 1 tablet by mouth every 6 (six) hours as needed for moderate pain.  ? metFORMIN (GLUCOPHAGE) 500 MG tablet Take 1 tablet (500 mg total) by mouth 2 (two) times daily.  ? Multiple Vitamin (MULTIVITAMIN WITH MINERALS) TABS tablet Take 1 tablet by mouth daily.  ? olmesartan (BENICAR) 40 MG tablet Take 1 tablet (40 mg total) by mouth daily. (Patient taking  differently: Take 40 mg by mouth every morning.)  ? PRESCRIPTION MEDICATION Apply 1 application topically daily as needed (pain). Gabapentin 10%  topical cream  ? rosuvastatin (CRESTOR) 5 MG tablet Take 1 tablet (5 mg total) by mouth daily. (Patient taking differently: Take 5 mg by mouth every morning.)  ? ?No current facility-administered medications on file prior to visit.  ? ? ?Allergies:  ?Allergies  ?Allergen Reactions  ? Penicillins Hives  ? ? ?Social History:  ?Social History  ? ?Socioeconomic History  ? Marital status: Single  ?  Spouse name: Not on file  ? Number of children: Not on file  ? Years of education: Not on file  ? Highest education level: Not on file  ?Occupational History  ? Not on file  ?Tobacco Use  ? Smoking status: Former  ?  Packs/day: 0.25  ?  Years: 25.00  ?  Pack years: 6.25  ?  Types: Cigarettes  ?  Quit date: 07/02/2021  ?  Years since quitting: 0.2  ?  Passive exposure: Never  ? Smokeless tobacco: Never  ?Vaping Use  ? Vaping Use: Never used  ?Substance and Sexual Activity  ? Alcohol use: Never  ? Drug use: Yes  ?  Types: Marijuana  ?  Comment: on occasion  ? Sexual activity: Not Currently  ?Other Topics Concern  ? Not on file  ?Social History Narrative  ? Not on file  ? ?Social Determinants of Health  ? ?Financial Resource Strain: Not on file  ?Food Insecurity: Not on file  ?Transportation Needs: Not on file  ?Physical Activity: Not on file  ?Stress: Not on file  ?Social Connections: Not on file  ?Intimate Partner Violence: Not on file  ? ?Social History  ? ?Tobacco Use  ?Smoking Status Former  ? Packs/day: 0.25  ? Years: 25.00  ? Pack years: 6.25  ? Types: Cigarettes  ? Quit date: 07/02/2021  ? Years since quitting: 0.2  ? Passive exposure: Never  ?Smokeless Tobacco Never  ? ?Social History  ? ?Substance and Sexual Activity  ?Alcohol Use Never  ? ? ?Family History:  ?Family History  ?Problem Relation Age of Onset  ? Diabetes Mother   ? Hypertension Mother   ? Heart disease Father    ? Diabetes Sister   ? Hypertension Sister   ? Hypertension Sister   ? Hypertension Sister   ? Hypertension Sister   ? Diabetes Brother   ? Hypertension Brother   ? Heart disease Brother   ? Mental illness Brother   ? Depression Brother   ? Diabetes Brother   ? Hypertension Brother   ? Hypertension Brother   ? Asthma Son   ? Diabetes Maternal Grandmother   ? Hypertension Paternal Grandmother   ? Hypertension Paternal Grandfather   ? ? ?Past medical history, surgical history, medications, allergies, family history and social history reviewed with patient today and changes made to appropriate areas of the chart.  ? ?  Review of Systems  ?HENT:    ?     Denies vision changes.  ?Eyes:  Negative for blurred vision and double vision.  ?Respiratory:  Negative for shortness of breath.   ?Cardiovascular:  Negative for chest pain, palpitations and leg swelling.  ?Neurological:  Negative for dizziness, tingling and headaches.  ?Endo/Heme/Allergies:  Positive for polydipsia.  ?     Denies Polyuria  ?All other ROS negative except what is listed above and in the HPI.  ? ?   ?Objective:  ?  ?BP 115/63   Pulse 66   Temp 98.5 ?F (36.9 ?C) (Oral)   Ht 5' 2.6" (1.59 m)   Wt 211 lb (95.7 kg)   LMP 09/08/2021 (Approximate)   SpO2 97%   BMI 37.86 kg/m?   ?Wt Readings from Last 3 Encounters:  ?09/22/21 211 lb (95.7 kg)  ?09/17/21 213 lb 12.8 oz (97 kg)  ?08/27/21 213 lb 12.8 oz (97 kg)  ?  ?Physical Exam ?Vitals and nursing note reviewed.  ?Constitutional:   ?   General: She is awake. She is not in acute distress. ?   Appearance: She is well-developed. She is obese. She is not ill-appearing.  ?HENT:  ?   Head: Normocephalic and atraumatic.  ?   Right Ear: Hearing, tympanic membrane, ear canal and external ear normal. No drainage.  ?   Left Ear: Hearing, tympanic membrane, ear canal and external ear normal. No drainage.  ?   Nose: Nose normal.  ?   Right Sinus: No maxillary sinus tenderness or frontal sinus tenderness.  ?   Left  Sinus: No maxillary sinus tenderness or frontal sinus tenderness.  ?   Mouth/Throat:  ?   Mouth: Mucous membranes are moist.  ?   Pharynx: Oropharynx is clear. Uvula midline. No pharyngeal swelling, oropharyngeal exudate or poster

## 2021-09-22 NOTE — Assessment & Plan Note (Signed)
Chronic.  Controlled.  Continue with current medication regimen of Olmesartan '40mg'$  and Amlodipine '5mg'$  daily.  Refills sent today.  Labs ordered today.  Return to clinic in 3 months for reevaluation.  Call sooner if concerns arise.  ? ?

## 2021-09-22 NOTE — Assessment & Plan Note (Signed)
Recommend a diet low in fat and low in carbs.  Recommend smaller meals that prioritize protein.   ?

## 2021-09-23 LAB — HEPATITIS C ANTIBODY: Hep C Virus Ab: NONREACTIVE

## 2021-09-23 LAB — LIPID PANEL
Chol/HDL Ratio: 2.5 ratio (ref 0.0–4.4)
Cholesterol, Total: 168 mg/dL (ref 100–199)
HDL: 66 mg/dL (ref >39)
LDL Chol Calc (NIH): 90 mg/dL (ref 0–99)
Triglycerides: 63 mg/dL (ref 0–149)
VLDL Cholesterol Cal: 12 mg/dL (ref 5–40)

## 2021-09-23 LAB — CBC WITH DIFFERENTIAL/PLATELET
Basophils Absolute: 0 10*3/uL (ref 0.0–0.2)
Basos: 0 %
EOS (ABSOLUTE): 0 10*3/uL (ref 0.0–0.4)
Eos: 1 %
Hematocrit: 39.3 % (ref 34.0–46.6)
Hemoglobin: 13.4 g/dL (ref 11.1–15.9)
Immature Grans (Abs): 0 10*3/uL (ref 0.0–0.1)
Immature Granulocytes: 0 %
Lymphocytes Absolute: 4.6 10*3/uL — ABNORMAL HIGH (ref 0.7–3.1)
Lymphs: 56 %
MCH: 30.9 pg (ref 26.6–33.0)
MCHC: 34.1 g/dL (ref 31.5–35.7)
MCV: 91 fL (ref 79–97)
Monocytes Absolute: 0.5 10*3/uL (ref 0.1–0.9)
Monocytes: 7 %
Neutrophils Absolute: 2.9 10*3/uL (ref 1.4–7.0)
Neutrophils: 36 %
Platelets: 210 10*3/uL (ref 150–450)
RBC: 4.33 x10E6/uL (ref 3.77–5.28)
RDW: 14 % (ref 11.7–15.4)
WBC: 8.1 10*3/uL (ref 3.4–10.8)

## 2021-09-23 LAB — COMPREHENSIVE METABOLIC PANEL
ALT: 52 IU/L — ABNORMAL HIGH (ref 0–32)
AST: 24 IU/L (ref 0–40)
Albumin/Globulin Ratio: 1.7 (ref 1.2–2.2)
Albumin: 4.4 g/dL (ref 3.8–4.9)
Alkaline Phosphatase: 108 IU/L (ref 44–121)
BUN/Creatinine Ratio: 16 (ref 9–23)
BUN: 12 mg/dL (ref 6–24)
Bilirubin Total: 0.3 mg/dL (ref 0.0–1.2)
CO2: 26 mmol/L (ref 20–29)
Calcium: 9.3 mg/dL (ref 8.7–10.2)
Chloride: 101 mmol/L (ref 96–106)
Creatinine, Ser: 0.75 mg/dL (ref 0.57–1.00)
Globulin, Total: 2.6 g/dL (ref 1.5–4.5)
Glucose: 144 mg/dL — ABNORMAL HIGH (ref 70–99)
Potassium: 3.6 mmol/L (ref 3.5–5.2)
Sodium: 140 mmol/L (ref 134–144)
Total Protein: 7 g/dL (ref 6.0–8.5)
eGFR: 96 mL/min/{1.73_m2} (ref >59)

## 2021-09-23 LAB — HEMOGLOBIN A1C
Est. average glucose Bld gHb Est-mCnc: 177 mg/dL
Hgb A1c MFr Bld: 7.8 % — ABNORMAL HIGH (ref 4.8–5.6)

## 2021-09-23 LAB — HIV ANTIBODY (ROUTINE TESTING W REFLEX): HIV Screen 4th Generation wRfx: NONREACTIVE

## 2021-09-23 LAB — TSH: TSH: 1.04 u[IU]/mL (ref 0.450–4.500)

## 2021-09-23 NOTE — Progress Notes (Signed)
Please let patient know that her lab work shows that her A1c is 7.8.  I think she would benefit from starting Ozempic 0.'25mg'$  weekly.  This is an injection that can help with kidney protection as well as weight loss.  I think this will help her A1c.  I also recommend she work on a low carb diet and exercise.  If she agrees to the medication I can send it to the pharmacy.  If she is nervous about the injection I can show her how to use it.   ? ?Her other blood work looks good.  No other concerns at this time.

## 2021-09-24 DIAGNOSIS — M75101 Unspecified rotator cuff tear or rupture of right shoulder, not specified as traumatic: Secondary | ICD-10-CM | POA: Diagnosis not present

## 2021-10-02 NOTE — Progress Notes (Signed)
? ?BP 121/81   Pulse 60   Temp 98.6 ?F (37 ?C) (Oral)   Wt 204 lb (92.5 kg)   LMP 09/08/2021 (Approximate)   SpO2 97%   BMI 36.60 kg/m?   ? ?Subjective:  ? ? Patient ID: Carla Gill, female    DOB: 09-22-1970, 51 y.o.   MRN: [id number] ? ?HPI: ?Carla Gill is a 51 y.o. female ? ?Chief Complaint  ?Patient presents with  ? Form Completion  ?  FMLA ? ?Pt requesting microalbumin test for insurance purposes. No urinary sxs per pt.   ? ?Patient presents to clinic to have FMLA paperwork filled out for her chronic conditions.  Patient likes to have approval for her time off for her appointments.  Patient is also requesting a microalbumin test for her insurance.  ? ? Denies HA, CP, SOB, dizziness, palpitations, visual changes, and lower extremity swelling. ? ?Relevant past medical, surgical, family and social history reviewed and updated as indicated. Interim medical history since our last visit reviewed. ?Allergies and medications reviewed and updated. ? ?Review of Systems  ?Eyes:  Negative for visual disturbance.  ?Respiratory:  Negative for cough, chest tightness and shortness of breath.   ?Cardiovascular:  Negative for chest pain, palpitations and leg swelling.  ?Neurological:  Negative for dizziness and headaches.  ? ?Per HPI unless specifically indicated above ? ?   ?Objective:  ?  ?BP 121/81   Pulse 60   Temp 98.6 ?F (37 ?C) (Oral)   Wt 204 lb (92.5 kg)   LMP 09/08/2021 (Approximate)   SpO2 97%   BMI 36.60 kg/m?   ?Wt Readings from Last 3 Encounters:  ?10/03/21 204 lb (92.5 kg)  ?09/22/21 211 lb (95.7 kg)  ?09/17/21 213 lb 12.8 oz (97 kg)  ?  ?Physical Exam ?Vitals and nursing note reviewed.  ?Constitutional:   ?   General: She is not in acute distress. ?   Appearance: Normal appearance. She is obese. She is not ill-appearing, toxic-appearing or diaphoretic.  ?HENT:  ?   Head: Normocephalic.  ?   Right Ear: External ear normal.  ?   Left Ear: External ear normal.  ?   Nose: Nose normal.  ?    Mouth/Throat:  ?   Mouth: Mucous membranes are moist.  ?   Pharynx: Oropharynx is clear.  ?Eyes:  ?   General:     ?   Right eye: No discharge.     ?   Left eye: No discharge.  ?   Extraocular Movements: Extraocular movements intact.  ?   Conjunctiva/sclera: Conjunctivae normal.  ?   Pupils: Pupils are equal, round, and reactive to light.  ?Cardiovascular:  ?   Rate and Rhythm: Normal rate and regular rhythm.  ?   Heart sounds: No murmur heard. ?Pulmonary:  ?   Effort: Pulmonary effort is normal. No respiratory distress.  ?   Breath sounds: Normal breath sounds. No wheezing or rales.  ?Musculoskeletal:  ?   Cervical back: Normal range of motion and neck supple.  ?Skin: ?   General: Skin is warm and dry.  ?   Capillary Refill: Capillary refill takes less than 2 seconds.  ?Neurological:  ?   General: No focal deficit present.  ?   Mental Status: She is alert and oriented to person, place, and time. Mental status is at baseline.  ?Psychiatric:     ?   Mood and Affect: Mood normal.     ?   Behavior: Behavior normal.     ?  Thought Content: Thought content normal.     ?   Judgment: Judgment normal.  ? ? ?Results for orders placed or performed in visit on 09/22/21  ?CBC with Differential/Platelet  ?Result Value Ref Range  ? WBC 8.1 3.4 - 10.8 x10E3/uL  ? RBC 4.33 3.77 - 5.28 x10E6/uL  ? Hemoglobin 13.4 11.1 - 15.9 g/dL  ? Hematocrit 39.3 34.0 - 46.6 %  ? MCV 91 79 - 97 fL  ? MCH 30.9 26.6 - 33.0 pg  ? MCHC 34.1 31.5 - 35.7 g/dL  ? RDW 14.0 11.7 - 15.4 %  ? Platelets 210 150 - 450 x10E3/uL  ? Neutrophils 36 Not Estab. %  ? Lymphs 56 Not Estab. %  ? Monocytes 7 Not Estab. %  ? Eos 1 Not Estab. %  ? Basos 0 Not Estab. %  ? Neutrophils Absolute 2.9 1.4 - 7.0 x10E3/uL  ? Lymphocytes Absolute 4.6 (H) 0.7 - 3.1 x10E3/uL  ? Monocytes Absolute 0.5 0.1 - 0.9 x10E3/uL  ? EOS (ABSOLUTE) 0.0 0.0 - 0.4 x10E3/uL  ? Basophils Absolute 0.0 0.0 - 0.2 x10E3/uL  ? Immature Granulocytes 0 Not Estab. %  ? Immature Grans (Abs) 0.0 0.0 - 0.1  x10E3/uL  ?Comprehensive metabolic panel  ?Result Value Ref Range  ? Glucose 144 (H) 70 - 99 mg/dL  ? BUN 12 6 - 24 mg/dL  ? Creatinine, Ser 0.75 0.57 - 1.00 mg/dL  ? eGFR 96 >59 mL/min/1.73  ? BUN/Creatinine Ratio 16 9 - 23  ? Sodium 140 134 - 144 mmol/L  ? Potassium 3.6 3.5 - 5.2 mmol/L  ? Chloride 101 96 - 106 mmol/L  ? CO2 26 20 - 29 mmol/L  ? Calcium 9.3 8.7 - 10.2 mg/dL  ? Total Protein 7.0 6.0 - 8.5 g/dL  ? Albumin 4.4 3.8 - 4.9 g/dL  ? Globulin, Total 2.6 1.5 - 4.5 g/dL  ? Albumin/Globulin Ratio 1.7 1.2 - 2.2  ? Bilirubin Total 0.3 0.0 - 1.2 mg/dL  ? Alkaline Phosphatase 108 44 - 121 IU/L  ? AST 24 0 - 40 IU/L  ? ALT 52 (H) 0 - 32 IU/L  ?Lipid panel  ?Result Value Ref Range  ? Cholesterol, Total 168 100 - 199 mg/dL  ? Triglycerides 63 0 - 149 mg/dL  ? HDL 66 >39 mg/dL  ? VLDL Cholesterol Cal 12 5 - 40 mg/dL  ? LDL Chol Calc (NIH) 90 0 - 99 mg/dL  ? Chol/HDL Ratio 2.5 0.0 - 4.4 ratio  ?TSH  ?Result Value Ref Range  ? TSH 1.040 0.450 - 4.500 uIU/mL  ?Urinalysis, Routine w reflex microscopic  ?Result Value Ref Range  ? Specific Gravity, UA 1.025 1.005 - 1.030  ? pH, UA 6.0 5.0 - 7.5  ? Color, UA Yellow Yellow  ? Appearance Ur Clear Clear  ? Leukocytes,UA Negative Negative  ? Protein,UA Negative Negative/Trace  ? Glucose, UA Negative Negative  ? Ketones, UA Negative Negative  ? RBC, UA Negative Negative  ? Bilirubin, UA Negative Negative  ? Urobilinogen, Ur 0.2 0.2 - 1.0 mg/dL  ? Nitrite, UA Negative Negative  ?HgB A1c  ?Result Value Ref Range  ? Hgb A1c MFr Bld 7.8 (H) 4.8 - 5.6 %  ? Est. average glucose Bld gHb Est-mCnc 177 mg/dL  ?HIV antibody (with reflex)  ?Result Value Ref Range  ? HIV Screen 4th Generation wRfx Non Reactive Non Reactive  ?Hepatitis C antibody  ?Result Value Ref Range  ? Hep C Virus Ab Non Reactive  Non Reactive  ? ?   ?Assessment & Plan:  ? ?Problem List Items Addressed This Visit   ? ?  ? Endocrine  ? Controlled diabetes mellitus type 2 with complications ([place]) - Primary  ?  Chronic.  Ongoing.  Patient has been working on changing her diet and weight loss.  She has not started the Ozempic due to wanting to lose weight without medication.  Encouraged her to continue with weight loss efforts.  Follow up at regularly scheduled appt.  Microalbumin ordered.  ?  ?  ? Relevant Orders  ? Microalbumin, Urine Waived  ? ?Other Visit Diagnoses   ? ? Encounter for completion of form with patient      ? FMLA paperwork filled out for patient.    ? ?  ?  ? ?Follow up plan: ?No follow-ups on file. ? ? ? ? ? ?

## 2021-10-03 ENCOUNTER — Ambulatory Visit: Payer: Federal, State, Local not specified - PPO | Admitting: Nurse Practitioner

## 2021-10-03 ENCOUNTER — Encounter: Payer: Self-pay | Admitting: Nurse Practitioner

## 2021-10-03 VITALS — BP 121/81 | HR 60 | Temp 98.6°F | Wt 204.0 lb

## 2021-10-03 DIAGNOSIS — E118 Type 2 diabetes mellitus with unspecified complications: Secondary | ICD-10-CM

## 2021-10-03 DIAGNOSIS — Z0289 Encounter for other administrative examinations: Secondary | ICD-10-CM | POA: Diagnosis not present

## 2021-10-03 LAB — MICROALBUMIN, URINE WAIVED
Creatinine, Urine Waived: 200 mg/dL (ref 10–300)
Microalb, Ur Waived: 10 mg/L (ref 0–19)
Microalb/Creat Ratio: <30 mg/g (ref <30)

## 2021-10-03 NOTE — Progress Notes (Signed)
Hi Tamakia. Your microalbumin looks great.  This means your kidneys are happy! We will continue to monitor in the future.

## 2021-10-03 NOTE — Assessment & Plan Note (Signed)
Chronic. Ongoing.  Patient has been working on changing her diet and weight loss.  She has not started the Ozempic due to wanting to lose weight without medication.  Encouraged her to continue with weight loss efforts.  Follow up at regularly scheduled appt.  Microalbumin ordered.  ?

## 2021-10-13 ENCOUNTER — Other Ambulatory Visit: Payer: Self-pay | Admitting: Internal Medicine

## 2021-10-21 ENCOUNTER — Encounter: Payer: Self-pay | Admitting: Internal Medicine

## 2021-10-21 ENCOUNTER — Telehealth (INDEPENDENT_AMBULATORY_CARE_PROVIDER_SITE_OTHER): Payer: Federal, State, Local not specified - PPO | Admitting: Internal Medicine

## 2021-10-21 ENCOUNTER — Ambulatory Visit: Payer: Self-pay | Admitting: *Deleted

## 2021-10-21 VITALS — BP 138/87 | HR 80 | Temp 99.2°F

## 2021-10-21 DIAGNOSIS — U071 COVID-19: Secondary | ICD-10-CM | POA: Insufficient documentation

## 2021-10-21 MED ORDER — MOLNUPIRAVIR EUA 200MG CAPSULE: 4.0000 | ORAL_CAPSULE | Freq: Two times a day (BID) | ORAL | 0 refills | Status: AC

## 2021-10-21 NOTE — Telephone Encounter (Signed)
Summary: fever/chills/headache  ? Pt started getting fever yesterday afternoon.  Last night fever was 101.4.  Fever coming down, 100.2 this morning.  Pt has chills, along w/ a severe headache.  It hurts to open her eyes w/ this headache.  ?Pt wanted virtual. Booked w/ Dr Neomia Dear at 4:20 today. (She will need work note) pt is going to do home covid test when we hang up.   ?  ? ?Called patient to review sx. No answer, voicemail box not set up . Unable to leave message.  ?

## 2021-10-21 NOTE — Telephone Encounter (Signed)
? ?  Chief Complaint: at home test positive for covid .  ?Symptoms: fever, chills, severe headache ?Frequency: started yesterday  ?Pertinent Negatives: Patient denies chest pain difficulty breathing, no sore throat, no cough ?Disposition: '[]'$ ED /'[]'$ Urgent Care (no appt availability in office) / '[x]'$ Appointment(In office/virtual)/ '[]'$  [facility]/ '[]'$ Home Care/ '[]'$ Refused Recommended Disposition /'[]'$ [place] [facility]/ '[]'$  Follow-up with PCP ?Additional Notes:  ? ?VV scheduled for today  hx diabetic  ? ? ? ? ? ?Reason for Disposition ? [1] HIGH RISK for severe COVID complications (e.g., weak immune system, age > 81 years, obesity with BMI > 25, pregnant, chronic lung disease or other chronic medical condition) AND [2] COVID symptoms (e.g., cough, fever)  (Exceptions: Already seen by PCP and no new or worsening symptoms.) ? ?Answer Assessment - Initial Assessment Questions ?1. COVID-19 DIAGNOSIS: "Who made your COVID-19 diagnosis?" "Was it confirmed by a positive lab test or self-test?" If not diagnosed by a doctor (or NP/PA), ask "Are there lots of cases (community spread) where you live?" Note: See public health department website, if unsure. ?    Took at home test and reports "test has one line" reports directions show positive ?2. COVID-19 EXPOSURE: "Was there any known exposure to COVID before the symptoms began?" CDC Definition of close contact: within 6 feet (2 meters) for a total of 15 minutes or more over a 24-hour period.  ?    no ?3. ONSET: "When did the COVID-19 symptoms start?"  ?    Yesterday  ?4. WORST SYMPTOM: "What is your worst symptom?" (e.g., cough, fever, shortness of breath, muscle aches) ?    Headache  ?5. COUGH: "Do you have a cough?" If Yes, ask: "How bad is the cough?"   ?    no ?6. FEVER: "Do you have a fever?" If Yes, ask: "What is your temperature, how was it measured, and when did it start?" ?    Yes 100.2 this am  ?7. RESPIRATORY STATUS: "Describe your breathing?" (e.g.,  shortness of breath, wheezing, unable to speak)  ?    ok ?8. BETTER-SAME-WORSE: "Are you getting better, staying the same or getting worse compared to yesterday?"  If getting worse, ask, "In what way?" ?    na ?9. HIGH RISK DISEASE: "Do you have any chronic medical problems?" (e.g., asthma, heart or lung disease, weak immune system, obesity, etc.) ?    na ?10. VACCINE: "Have you had the COVID-19 vaccine?" If Yes, ask: "Which one, how many shots, when did you get it?" ?      Yes  ?11. BOOSTER: "Have you received your COVID-19 booster?" If Yes, ask: "Which one and when did you get it?" ?      Yes  ?12. PREGNANCY: "Is there any chance you are pregnant?" "When was your last menstrual period?" ?      na ?13. OTHER SYMPTOMS: "Do you have any other symptoms?"  (e.g., chills, fatigue, headache, loss of smell or taste, muscle pain, sore throat) ?      Fever, headache, chills shaking under blankets.  ?14. O2 SATURATION MONITOR:  "Do you use an oxygen saturation monitor (pulse oximeter) at home?" If Yes, ask "What is your reading (oxygen level) today?" "What is your usual oxygen saturation reading?" (e.g., 95%) ?      na ? ?Protocols used: Coronavirus ([id number]-73) Diagnosed or Suspected-A-AH ? ?

## 2021-10-21 NOTE — Progress Notes (Signed)
? ?BP 138/87   Pulse 80   Temp 99.2 ?F (37.3 ?C) (Oral)   ? ?Subjective:  ? ? Patient ID: Carla Gill, female    DOB: Oct 24, 1970, 51 y.o.   MRN: [id number] ? ?Chief Complaint  ?Patient presents with  ?? Covid Positive  ?  Tested positive for Covid this morning, symptoms started yesterday, headache, chills, and headache  ? ? ?HPI: ?Carla Gill is a 51 y.o. female ? ? ?This visit was completed via video visit through MyChart due to the restrictions of the COVID-19 pandemic. All issues as above were discussed and addressed. Physical exam was done as above through visual confirmation on video through MyChart. If it was felt that the patient should be evaluated in the office, they were directed there. The patient verbally consented to this visit. ?Location of the patient: home ?Location of the provider: work ?Those involved with this call:  ?Provider: Charlynne Cousins, MD ?CMA: Frazier Butt, CMA ?Front Desk/Registration: [employer]  ?Time spent on call: 15 minutes with patient face to face via video conference. More than 50% of this time was spent in counseling and coordination of care. 15 minutes total spent in review of patient's record and preparation of their chart. ? ? ? ?Fever  ?This is a new problem. The maximum temperature noted was 101 to 101.9 F. Associated symptoms include congestion and headaches. Pertinent negatives include no abdominal pain, chest pain, coughing, diarrhea, ear pain, muscle aches, nausea, rash, sleepiness, sore throat, urinary pain, vomiting or wheezing.  ? ?Chief Complaint  ?Patient presents with  ?? Covid Positive  ?  Tested positive for Covid this morning, symptoms started yesterday, headache, chills, and headache  ? ? ?Relevant past medical, surgical, family and social history reviewed and updated as indicated. Interim medical history since our last visit reviewed. ?Allergies and medications reviewed and updated. ? ?Review of Systems  ?Constitutional:  Positive for fever.  ?HENT:   Positive for congestion. Negative for ear pain and sore throat.   ?Respiratory:  Negative for cough and wheezing.   ?Cardiovascular:  Negative for chest pain.  ?Gastrointestinal:  Negative for abdominal pain, diarrhea, nausea and vomiting.  ?Genitourinary:  Negative for dysuria.  ?Skin:  Negative for rash.  ?Neurological:  Positive for headaches.  ? ?Per HPI unless specifically indicated above ? ?   ?Objective:  ?  ?BP 138/87   Pulse 80   Temp 99.2 ?F (37.3 ?C) (Oral)   ?Wt Readings from Last 3 Encounters:  ?10/03/21 204 lb (92.5 kg)  ?09/22/21 211 lb (95.7 kg)  ?09/17/21 213 lb 12.8 oz (97 kg)  ?  ?Physical Exam ?Vitals reviewed: PT Was seen virtually today.  ?Constitutional:   ?   General: She is not in acute distress. ?   Appearance: Normal appearance. She is not ill-appearing, toxic-appearing or diaphoretic.  ?Skin: ?   Coloration: Skin is not jaundiced or pale.  ?   Findings: No bruising.  ?Neurological:  ?   Mental Status: She is alert.  ?Psychiatric:     ?   Behavior: Behavior normal.     ?   Thought Content: Thought content normal.     ?   Judgment: Judgment normal.  ? ?Results for orders placed or performed in visit on 10/03/21  ?Microalbumin, Urine Waived  ?Result Value Ref Range  ? Microalb, Ur Waived 10 0 - 19 mg/L  ? Creatinine, Urine Waived 200 10 - 300 mg/dL  ? Microalb/Creat Ratio <30 <30 mg/g  ? ?   ? ? ?  Current Outpatient Medications:  ??  acetaminophen (TYLENOL) 500 MG tablet, Take 2 tablets (1,000 mg total) by mouth every 6 (six) hours as needed for mild pain., Disp: , Rfl:  ??  albuterol (VENTOLIN HFA) 108 (90 Base) MCG/ACT inhaler, INHALE 1 PUFF INTO THE LUNGS EVERY 6 HOURS AS NEEDED FOR WHEEZING OR SHORTNESS OF BREATH, Disp: 18 g, Rfl: 2 ??  amLODipine (NORVASC) 5 MG tablet, Take 5 mg by mouth every morning., Disp: , Rfl:  ??  diphenhydrAMINE (BENADRYL) 25 MG tablet, Take 25 mg by mouth daily as needed for allergies., Disp: , Rfl:  ??  metFORMIN (GLUCOPHAGE) 500 MG tablet, Take 1 tablet  (500 mg total) by mouth 2 (two) times daily., Disp: 180 tablet, Rfl: 1 ??  molnupiravir EUA (LAGEVRIO) 200 mg CAPS capsule, Take 4 capsules (800 mg total) by mouth 2 (two) times daily for 5 days., Disp: 40 capsule, Rfl: 0 ??  Multiple Vitamin (MULTIVITAMIN WITH MINERALS) TABS tablet, Take 1 tablet by mouth daily., Disp: , Rfl:  ??  olmesartan (BENICAR) 40 MG tablet, Take 1 tablet (40 mg total) by mouth daily. (Patient taking differently: Take 40 mg by mouth every morning.), Disp: 90 tablet, Rfl: 1 ??  PRESCRIPTION MEDICATION, Apply 1 application topically daily as needed (pain). Gabapentin 10%  topical cream, Disp: , Rfl:  ??  rosuvastatin (CRESTOR) 5 MG tablet, Take 1 tablet (5 mg total) by mouth daily. (Patient taking differently: Take 5 mg by mouth every morning.), Disp: 90 tablet, Rfl: 1  ? ? ?Assessment & Plan:  ?COVID : positive :  ?Increase fluid intake.  ?Headahce - tyelnol every 4-6 hrs prn and alternate this with ibubrufen 800 mg q 8 hrly. ?Sinus pressure: use steam inhalation.  ?OTC -  Allegra / claritin.  ?5 days quarantine.   ?Ok to rtw in 5 days if tests -ve follow. ?Pt will need to come in wait in the car and get swabbed for flu and COVID test today  ?Pt verbalized understanding of such, to get to the office at today and get a curb side test for the above. ? ? ? ? ?Problem List Items Addressed This Visit   ? ?  ? Other  ? COVID-19 - Primary  ? Relevant Medications  ? molnupiravir EUA (LAGEVRIO) 200 mg CAPS capsule  ?  ? ?No orders of the defined types were placed in this encounter. ?  ? ?Meds ordered this encounter  ?Medications  ?? molnupiravir EUA (LAGEVRIO) 200 mg CAPS capsule  ?  Sig: Take 4 capsules (800 mg total) by mouth 2 (two) times daily for 5 days.  ?  Dispense:  40 capsule  ?  Refill:  0  ?  ? ?Follow up plan: ?No follow-ups on file. ? ? ? ?

## 2021-10-29 ENCOUNTER — Telehealth (INDEPENDENT_AMBULATORY_CARE_PROVIDER_SITE_OTHER): Payer: Federal, State, Local not specified - PPO | Admitting: Internal Medicine

## 2021-10-29 ENCOUNTER — Encounter: Payer: Self-pay | Admitting: Internal Medicine

## 2021-10-29 ENCOUNTER — Ambulatory Visit: Payer: Self-pay | Admitting: *Deleted

## 2021-10-29 ENCOUNTER — Other Ambulatory Visit: Payer: Self-pay | Admitting: Internal Medicine

## 2021-10-29 VITALS — BP 155/96 | HR 64

## 2021-10-29 DIAGNOSIS — I10 Essential (primary) hypertension: Secondary | ICD-10-CM

## 2021-10-29 DIAGNOSIS — U071 COVID-19: Secondary | ICD-10-CM

## 2021-10-29 MED ORDER — AMLODIPINE BESYLATE 10 MG PO TABS: 10.0000 mg | ORAL_TABLET | ORAL | 1 refills | Status: DC

## 2021-10-29 NOTE — Telephone Encounter (Signed)
?  Chief Complaint: Fainted ?Symptoms: Fainted Sunday LOC 5-10 minutes, no injuries, witnessed. BP at that time 75/68, unsure of HR. Since Sunday BP 123/78  109/78  This AM 143/90. Slight headache presently. Tested positive covid 10/21/21 . SOB "When wearing mask at work."  Taking BP meds as ordered ?Frequency: Sunday ?Pertinent Negatives: Patient denies dizziness ?Disposition: '[]'$ ED /'[]'$ Urgent Care (no appt availability in office) / '[x]'$ Appointment(In office/virtual)/ '[]'$  [facility]/ '[]'$ Home Care/ '[]'$ Refused Recommended Disposition /'[]'$ [facility]/ '[]'$  Follow-up with PCP ?Additional Notes: Appt secured for today. Care advise given, pt verbalizes understanding. Reason for Disposition ? [1] All other patients AND [2] now alert and feels fine  (Exception: SIMPLE FAINT due to stress, pain, prolonged standing, or suddenly standing) ? ?Answer Assessment - Initial Assessment Questions ?1. ONSET: "How long were you unconscious?" (minutes) "When did it happen?" ?    5-10 minutes ?2. CONTENT: "What happened during period of unconsciousness?" (e.g., seizure activity)  ?    No ?3. MENTAL STATUS: "Alert and oriented now?" (oriented x 3 = name, month, location)  ?    yes ?4. TRIGGER: "What do you think caused the fainting?" "What were you doing just before you fainted?"  (e.g., exercise, sudden standing up, prolonged standing) ?    BP low at times  "Felt funny" ?5. RECURRENT SYMPTOM: "Have you ever passed out before?" If Yes, ask: "When was the last time?" and "What happened that time?"  ?    No ?6. INJURY: "Did you sustain any injury during the fall?"  ?    None ?7. CARDIAC SYMPTOMS: "Have you had any of the following symptoms: chest pain, difficulty breathing, palpitations?" ?     ?8. NEUROLOGIC SYMPTOMS: "Have you had any of the following symptoms: headache, numbness, vertigo, weakness?" ?     ?9. GI SYMPTOMS: "Have you had any of the following symptoms: abdominal pain, vomiting, diarrhea, blood in stools?" ?     No ?10. OTHER SYMPTOMS: "Do you have any other symptoms?" ?      Slight headache this AM, SOB with mask at work "Allergies acting up." ? ?Protocols used: Fainting-A-AH ? ?

## 2021-10-29 NOTE — Telephone Encounter (Signed)
Pt has had covid last week needs to come in 10 days from such and if symptoms recure needs to go to the ER please call her and discuss thnx

## 2021-10-29 NOTE — Progress Notes (Signed)
? ?BP (!) 155/96   Pulse 64   ? ?Subjective:  ? ? Patient ID: Carla Gill, female    DOB: 1971-04-23, 51 y.o.   MRN: [id number] ? ?Chief Complaint  ?Patient presents with  ?? Loss of Consciousness  ?  Happened on Sunday, BP at that time was 75/68, Took BP this morning at 7am 140/93, pt has taken BP now 10am 155/96  ?? Covid Positive  ?  Tested positive on 4/18  SOB still, still having chest pressure  ? ? ?HPI: ?Carla Gill is a 51 y.o. female ? ? ?This visit was completed via telephone due to the restrictions of the COVID-19 pandemic. All issues as above were discussed and addressed but no physical exam was performed. If it was felt that the patient should be evaluated in the office, they were directed there. The patient verbally consented to this visit. Patient was unable to complete an audio/visual visit due to Technical difficulties. Due to the catastrophic nature of the COVID-19 pandemic, this visit was done through audio contact only. ?Location of the patient: home ?Location of the provider: home ?Those involved with this call:  ?Provider: Charlynne Cousins, MD ?CMA: Frazier Butt, CMA ?Front Desk/Registration: [facility]  ?Time spent on call: 15 minutes on the phone discussing health concerns. 15 minutes total spent in review of patient's record and preparation of their chart. ?Pt diagnosed with covid about 6 days ago, had an episode of hypotension on Sunday this past week as she had rtw after being diagbosed with covid.she continues to have SOB with this has some chest congestion on and off. Feels fatigued. No fever or chills.  ? ? ?Loss of Consciousness ?This is a new (started back to work on saturday  sunday bp dropped and passed out was 75/68 mm hg - has a bp machine at home checks her sugar as well and it was normal at 130 pt was overworkedn and felt dehydrated) problem. Episode onset: didnt feel right and went to her room and passed out, no seizure like activity , ems was called , bp was low and  she was day 5 of COVID. Associated symptoms include malaise/fatigue. Pertinent negatives include no abdominal pain, chest pain, confusion, diaphoresis, dizziness, fever, focal weakness, headaches, light-headedness, nausea, palpitations, slurred speech or weakness.  ?Hypertension ?This is a chronic problem. Associated symptoms include malaise/fatigue and shortness of breath. Pertinent negatives include no anxiety, blurred vision, chest pain, headaches, neck pain, orthopnea, palpitations, peripheral edema, PND or sweats.  ? ?Chief Complaint  ?Patient presents with  ?? Loss of Consciousness  ?  Happened on Sunday, BP at that time was 75/68, Took BP this morning at 7am 140/93, pt has taken BP now 10am 155/96  ?? Covid Positive  ?  Tested positive on 4/18  SOB still, still having chest pressure  ? ? ?Relevant past medical, surgical, family and social history reviewed and updated as indicated. Interim medical history since our last visit reviewed. ?Allergies and medications reviewed and updated. ? ?Review of Systems  ?Constitutional:  Positive for malaise/fatigue. Negative for diaphoresis and fever.  ?Eyes:  Negative for blurred vision.  ?Respiratory:  Positive for shortness of breath.   ?Cardiovascular:  Positive for syncope. Negative for chest pain, palpitations, orthopnea and PND.  ?Gastrointestinal:  Negative for abdominal pain and nausea.  ?Musculoskeletal:  Negative for neck pain.  ?Neurological:  Negative for dizziness, focal weakness, weakness, light-headedness and headaches.  ?Psychiatric/Behavioral:  Negative for confusion.   ? ?Per HPI unless specifically  indicated above ? ?   ?Objective:  ?  ?BP (!) 155/96   Pulse 64   ?Wt Readings from Last 3 Encounters:  ?10/03/21 204 lb (92.5 kg)  ?09/22/21 211 lb (95.7 kg)  ?09/17/21 213 lb 12.8 oz (97 kg)  ?  ?Physical Exam ? ?Results for orders placed or performed in visit on 10/03/21  ?Microalbumin, Urine Waived  ?Result Value Ref Range  ? Microalb, Ur Waived 10 0 - 19  mg/L  ? Creatinine, Urine Waived 200 10 - 300 mg/dL  ? Microalb/Creat Ratio <30 <30 mg/g  ? ?   ? ? ?Current Outpatient Medications:  ??  acetaminophen (TYLENOL) 500 MG tablet, Take 2 tablets (1,000 mg total) by mouth every 6 (six) hours as needed for mild pain., Disp: , Rfl:  ??  albuterol (VENTOLIN HFA) 108 (90 Base) MCG/ACT inhaler, INHALE 1 PUFF INTO THE LUNGS EVERY 6 HOURS AS NEEDED FOR WHEEZING OR SHORTNESS OF BREATH, Disp: 18 g, Rfl: 2 ??  diphenhydrAMINE (BENADRYL) 25 MG tablet, Take 25 mg by mouth daily as needed for allergies., Disp: , Rfl:  ??  metFORMIN (GLUCOPHAGE) 500 MG tablet, Take 1 tablet (500 mg total) by mouth 2 (two) times daily., Disp: 180 tablet, Rfl: 1 ??  Multiple Vitamin (MULTIVITAMIN WITH MINERALS) TABS tablet, Take 1 tablet by mouth daily., Disp: , Rfl:  ??  olmesartan (BENICAR) 40 MG tablet, Take 1 tablet (40 mg total) by mouth daily. (Patient taking differently: Take 40 mg by mouth every morning.), Disp: 90 tablet, Rfl: 1 ??  rosuvastatin (CRESTOR) 5 MG tablet, Take 1 tablet (5 mg total) by mouth daily. (Patient taking differently: Take 5 mg by mouth every morning.), Disp: 90 tablet, Rfl: 1 ??  amLODipine (NORVASC) 10 MG tablet, Take 1 tablet (10 mg total) by mouth every morning., Disp: 30 tablet, Rfl: 1 ??  PRESCRIPTION MEDICATION, Apply 1 application topically daily as needed (pain). Gabapentin 10%  topical cream (Patient not taking: Reported on 10/29/2021), Disp: , Rfl:   ? ? ?Assessment & Plan:  ?Low bp on Sunday: ? Sec to dehydration from COVID and overexertion at work.  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 Lot of movement.  ?Increase water itnake ? ?2. HTN took olmesartan and norvasc today,  ?Continue current meds.  Medication compliance emphasised. pt advised to keep Bp logs. Pt verbalised understanding of the same. Pt to have a low salt diet . Exercise to reach a goal of at least 150 mins a week.  lifestyle modifications explained and pt  understands importance of the above. ?Under good control on current regimen. Continue current regimen. Continue to monitor. Call with any concerns. Refills given. Labs drawn today. ? ?Dm stable Fasting sugars are higher.  ? ?Problem List Items Addressed This Visit   ? ?  ? Cardiovascular and Mediastinum  ? Hypertension - Primary  ? Relevant Medications  ? amLODipine (NORVASC) 10 MG tablet  ?  ? ?No orders of the defined types were placed in this encounter. ?  ? ?Meds ordered this encounter  ?Medications  ?? amLODipine (NORVASC) 10 MG tablet  ?  Sig: Take 1 tablet (10 mg total) by mouth every morning.  ?  Dispense:  30 tablet  ?  Refill:  1  ?  ? ?Follow up plan: ?Return in about 1 week (around 11/05/2021) for with PCP. ? ? ?

## 2021-10-30 NOTE — Telephone Encounter (Signed)
Requested medication (s) are due for refill today: No ? ?Requested medication (s) are on the active medication list: Yes ? ?Last refill:  10/29/21 ? ?Future visit scheduled: Yes ? ?Notes to clinic:  Pt. Requests 90 day supply. ? ? ? ?Requested Prescriptions  ?Pending Prescriptions Disp Refills  ? amLODipine (NORVASC) 10 MG tablet [Pharmacy Med Name: AMLODIPINE BESYLATE '10MG'$  TABLETS] 90 tablet   ?  Sig: TAKE 1 TABLET(10 MG) BY MOUTH EVERY MORNING  ?  ? Cardiovascular: Calcium Channel Blockers 2 Failed - 10/29/2021 12:06 PM  ?  ?  Failed - Last BP in normal range  ?  BP Readings from Last 1 Encounters:  ?10/29/21 (!) 155/96  ?  ?  ?  ?  Passed - Last Heart Rate in normal range  ?  Pulse Readings from Last 1 Encounters:  ?10/29/21 64  ?  ?  ?  ?  Passed - Valid encounter within last 6 months  ?  Recent Outpatient Visits   ? ?      ? Yesterday Primary hypertension  ? [facility] Vigg, Avanti, MD  ? 1 week ago COVID-19  ? [facility] Vigg, Avanti, MD  ? 3 weeks ago Controlled type 2 diabetes mellitus with complication, without long-term current use of insulin ([place])  ? [place], NP  ? 1 month ago Annual physical exam  ? [place], NP  ? 3 months ago Pre-op evaluation  ? [facility] Jon Billings, NP  ? ?  ?  ?Future Appointments   ? ?        ? In 1 month Jon Billings, NP [facility]  ? ?  ? ? ?  ?  ?  ? ?

## 2021-11-03 DIAGNOSIS — M25411 Effusion, right shoulder: Secondary | ICD-10-CM | POA: Diagnosis not present

## 2021-11-03 DIAGNOSIS — M25511 Pain in right shoulder: Secondary | ICD-10-CM | POA: Diagnosis not present

## 2021-11-10 ENCOUNTER — Other Ambulatory Visit: Payer: Self-pay | Admitting: Internal Medicine

## 2021-11-12 DIAGNOSIS — M25411 Effusion, right shoulder: Secondary | ICD-10-CM | POA: Diagnosis not present

## 2021-11-12 DIAGNOSIS — M25511 Pain in right shoulder: Secondary | ICD-10-CM | POA: Diagnosis not present

## 2021-11-19 DIAGNOSIS — M25511 Pain in right shoulder: Secondary | ICD-10-CM | POA: Diagnosis not present

## 2021-11-19 DIAGNOSIS — M25411 Effusion, right shoulder: Secondary | ICD-10-CM | POA: Diagnosis not present

## 2021-11-24 ENCOUNTER — Other Ambulatory Visit: Payer: Self-pay | Admitting: Internal Medicine

## 2021-11-25 NOTE — Telephone Encounter (Signed)
Requested by interface surescripts. Requesting too soon. Last refill doc. 10/29/21.  Requested Prescriptions  Refused Prescriptions Disp Refills  . amLODipine (NORVASC) 10 MG tablet [Pharmacy Med Name: AMLODIPINE BESYLATE '10MG'$  TABLETS] 90 tablet 0    Sig: TAKE 1 TABLET(10 MG) BY MOUTH EVERY MORNING     Cardiovascular: Calcium Channel Blockers 2 Failed - 11/24/2021  6:28 AM      Failed - Last BP in normal range    BP Readings from Last 1 Encounters:  10/29/21 (!) 155/96         Passed - Last Heart Rate in normal range    Pulse Readings from Last 1 Encounters:  10/29/21 64         Passed - Valid encounter within last 6 months    Recent Outpatient Visits          3 weeks ago Primary hypertension   [facility] Vigg, Avanti, MD   1 month ago [place] Vigg, Avanti, MD   1 month ago Controlled type 2 diabetes mellitus with complication, without long-term current use of insulin ([place])   [place], Karen, NP   2 months ago Annual physical exam   [facility] Jon Billings, NP   4 months ago Pre-op evaluation   [facility] Jon Billings, NP      Future Appointments            In 4 weeks Jon Billings, NP [facility] [place]

## 2021-11-28 DIAGNOSIS — M25511 Pain in right shoulder: Secondary | ICD-10-CM | POA: Diagnosis not present

## 2021-11-28 DIAGNOSIS — M25411 Effusion, right shoulder: Secondary | ICD-10-CM | POA: Diagnosis not present

## 2021-12-03 DIAGNOSIS — M25411 Effusion, right shoulder: Secondary | ICD-10-CM | POA: Diagnosis not present

## 2021-12-03 DIAGNOSIS — M25511 Pain in right shoulder: Secondary | ICD-10-CM | POA: Diagnosis not present

## 2021-12-17 ENCOUNTER — Other Ambulatory Visit: Payer: Self-pay | Admitting: Orthopedic Surgery

## 2021-12-17 DIAGNOSIS — M75101 Unspecified rotator cuff tear or rupture of right shoulder, not specified as traumatic: Secondary | ICD-10-CM | POA: Diagnosis not present

## 2021-12-18 ENCOUNTER — Other Ambulatory Visit: Payer: Self-pay | Admitting: Nurse Practitioner

## 2021-12-18 NOTE — Telephone Encounter (Signed)
Requested Prescriptions  Pending Prescriptions Disp Refills  . rosuvastatin (CRESTOR) 5 MG tablet [Pharmacy Med Name: ROSUVASTATIN '5MG'$  TABLETS] 90 tablet 0    Sig: TAKE 1 TABLET(5 MG) BY MOUTH DAILY     Cardiovascular:  Antilipid - Statins 2 Failed - 12/18/2021  6:28 AM      Failed - Lipid Panel in normal range within the last 12 months    Cholesterol, Total  Date Value Ref Range Status  09/22/2021 168 100 - 199 mg/dL Final   LDL Chol Calc (NIH)  Date Value Ref Range Status  09/22/2021 90 0 - 99 mg/dL Final   HDL  Date Value Ref Range Status  09/22/2021 66 >39 mg/dL Final   Triglycerides  Date Value Ref Range Status  09/22/2021 63 0 - 149 mg/dL Final         Passed - Cr in normal range and within 360 days    Creat  Date Value Ref Range Status  03/18/2021 0.69 0.50 - 1.03 mg/dL Final   Creatinine, Ser  Date Value Ref Range Status  09/22/2021 0.75 0.57 - 1.00 mg/dL Final         Passed - Patient is not pregnant      Passed - Valid encounter within last 12 months    Recent Outpatient Visits          1 month ago Primary hypertension   [facility] Vigg, Avanti, MD   1 month ago COVID-19   [employer] [facility], Avanti, MD   2 months ago Controlled type 2 diabetes mellitus with complication, without long-term current use of insulin ([place])   [place], Karen, NP   2 months ago Annual physical exam   [place], NP   5 months ago Pre-op evaluation   [facility] Jon Billings, NP      Future Appointments            In 5 days Jon Billings, NP [facility] [place]

## 2021-12-23 ENCOUNTER — Ambulatory Visit: Payer: Federal, State, Local not specified - PPO | Admitting: Nurse Practitioner

## 2021-12-30 ENCOUNTER — Ambulatory Visit
Admission: RE | Admit: 2021-12-30 | Discharge: 2021-12-30 | Disposition: A | Discharge status: Home / Self Care | Payer: Federal, State, Local not specified - PPO | Source: Ambulatory Visit | Attending: Orthopedic Surgery | Admitting: Orthopedic Surgery

## 2021-12-30 DIAGNOSIS — M75101 Unspecified rotator cuff tear or rupture of right shoulder, not specified as traumatic: Secondary | ICD-10-CM | POA: Diagnosis not present

## 2021-12-30 DIAGNOSIS — M25511 Pain in right shoulder: Secondary | ICD-10-CM | POA: Diagnosis not present

## 2022-01-05 DIAGNOSIS — M7581 Other shoulder lesions, right shoulder: Secondary | ICD-10-CM | POA: Diagnosis not present

## 2022-01-05 DIAGNOSIS — M75121 Complete rotator cuff tear or rupture of right shoulder, not specified as traumatic: Secondary | ICD-10-CM | POA: Diagnosis not present

## 2022-01-05 DIAGNOSIS — M7521 Bicipital tendinitis, right shoulder: Secondary | ICD-10-CM | POA: Diagnosis not present

## 2022-02-02 ENCOUNTER — Encounter: Payer: Self-pay | Admitting: Nurse Practitioner

## 2022-02-02 ENCOUNTER — Ambulatory Visit: Payer: Federal, State, Local not specified - PPO | Admitting: Nurse Practitioner

## 2022-02-02 VITALS — BP 121/76 | HR 65 | Temp 98.5°F | Wt 204.4 lb

## 2022-02-02 DIAGNOSIS — M75100 Unspecified rotator cuff tear or rupture of unspecified shoulder, not specified as traumatic: Secondary | ICD-10-CM | POA: Diagnosis not present

## 2022-02-02 DIAGNOSIS — E782 Mixed hyperlipidemia: Secondary | ICD-10-CM

## 2022-02-02 DIAGNOSIS — I10 Essential (primary) hypertension: Secondary | ICD-10-CM

## 2022-02-02 DIAGNOSIS — E118 Type 2 diabetes mellitus with unspecified complications: Secondary | ICD-10-CM

## 2022-02-02 DIAGNOSIS — E1169 Type 2 diabetes mellitus with other specified complication: Secondary | ICD-10-CM | POA: Insufficient documentation

## 2022-02-02 MED ORDER — ROSUVASTATIN CALCIUM 5 MG PO TABS: ORAL_TABLET | ORAL | 1 refills | Status: DC

## 2022-02-02 MED ORDER — METFORMIN HCL 500 MG PO TABS: 500.0000 mg | ORAL_TABLET | Freq: Two times a day (BID) | ORAL | 1 refills | Status: DC

## 2022-02-02 MED ORDER — IBUPROFEN 800 MG PO TABS: 800.0000 mg | ORAL_TABLET | Freq: Three times a day (TID) | ORAL | 0 refills | Status: DC | PRN

## 2022-02-02 NOTE — Assessment & Plan Note (Signed)
Chronic. Well controlled on Metformin '500mg'$  BID.  Labs ordered today.  Last A1c was 7.8 in March.  Sugars have been running 118-160.  Has been more elevated since tearing her rotator cuff.  Will make further recommendations based on lab results.  Follow up in 6 months.  Call sooner if concerns arise.

## 2022-02-02 NOTE — Assessment & Plan Note (Signed)
Chronic.  Controlled.  Continue with current medication regimen of Olmesartan '40mg'$  and Cardizem daily.  Refills sent today.  Labs ordered today.  Return to clinic in 6 months for reevaluation.  Call sooner if concerns arise.

## 2022-02-02 NOTE — Progress Notes (Signed)
BP 121/76   Pulse 65   Temp 98.5 F (36.9 C) (Oral)   Wt 204 lb 6.4 oz (92.7 kg)   SpO2 98%   BMI 36.67 kg/m    Subjective:    Patient ID: Carla Gill, female    DOB: Mar 20, 1971, 51 y.o.   MRN: [id number]  HPI: Carla Gill is a 51 y.o. female  Chief Complaint  Patient presents with   Diabetes    Patient states she would like A1C checked since it has been 3 months since it was checked. Patient also reports she is getting rotator cuff surgery soon and would like to have a rx for ibuprofen 800 MG to have s/p surgery. Also reports she needs refills on metformin    DIABETES Hypoglycemic episodes:no Polydipsia/polyuria: no Visual disturbance: no Chest pain: no Paresthesias: no Glucose Monitoring: yes  Accucheck frequency: BID  Fasting glucose: 118-140  Post prandial:  Evening:  Before meals: Taking Insulin?: no  Long acting insulin:  Short acting insulin: Blood Pressure Monitoring: daily Retinal Examination: Up to Date Foot Exam: Up to Date Diabetic Education: Not Completed Pneumovax: Up to Date Influenza: Up to Date Aspirin: no  HYPERTENSION / [place] Satisfied with current treatment? yes Duration of hypertension: years BP monitoring frequency: daily BP range: 151/100 BP medication side effects: no Past BP meds: diltiazem and olmesartan (benicar) Duration of hyperlipidemia: years Cholesterol medication side effects: no Cholesterol supplements: none Past cholesterol medications: rosuvastatin (crestor) Medication compliance: excellent compliance Aspirin: no Recent stressors: no Recurrent headaches: no Visual changes: no Palpitations: no Dyspnea: no Chest pain: no Lower extremity edema: no Dizzy/lightheaded: no   Relevant past medical, surgical, family and social history reviewed and updated as indicated. Interim medical history since our last visit reviewed. Allergies and medications reviewed and updated.  Review of Systems  Eyes:   Negative for visual disturbance.  Respiratory:  Negative for chest tightness and shortness of breath.   Cardiovascular:  Negative for chest pain, palpitations and leg swelling.  Endocrine: Negative for polydipsia and polyuria.  Neurological:  Negative for dizziness, light-headedness, numbness and headaches.    Per HPI unless specifically indicated above     Objective:    BP 121/76   Pulse 65   Temp 98.5 F (36.9 C) (Oral)   Wt 204 lb 6.4 oz (92.7 kg)   SpO2 98%   BMI 36.67 kg/m   Wt Readings from Last 3 Encounters:  02/02/22 204 lb 6.4 oz (92.7 kg)  10/03/21 204 lb (92.5 kg)  09/22/21 211 lb (95.7 kg)    Physical Exam Vitals and nursing note reviewed.  Constitutional:      General: She is not in acute distress.    Appearance: Normal appearance. She is normal weight. She is not ill-appearing, toxic-appearing or diaphoretic.  HENT:     Head: Normocephalic.     Right Ear: External ear normal.     Left Ear: External ear normal.     Nose: Nose normal.     Mouth/Throat:     Mouth: Mucous membranes are moist.     Pharynx: Oropharynx is clear.  Eyes:     General:        Right eye: No discharge.        Left eye: No discharge.     Extraocular Movements: Extraocular movements intact.     Conjunctiva/sclera: Conjunctivae normal.     Pupils: Pupils are equal, round, and reactive to light.  Cardiovascular:     Rate and Rhythm: Normal  rate and regular rhythm.     Heart sounds: No murmur heard. Pulmonary:     Effort: Pulmonary effort is normal. No respiratory distress.     Breath sounds: Normal breath sounds. No wheezing or rales.  Musculoskeletal:     Cervical back: Normal range of motion and neck supple.  Skin:    General: Skin is warm and dry.     Capillary Refill: Capillary refill takes less than 2 seconds.  Neurological:     General: No focal deficit present.     Mental Status: She is alert and oriented to person, place, and time. Mental status is at baseline.   Psychiatric:        Mood and Affect: Mood normal.        Behavior: Behavior normal.        Thought Content: Thought content normal.        Judgment: Judgment normal.     Results for orders placed or performed in visit on 10/03/21  Microalbumin, Urine Waived  Result Value Ref Range   Microalb, Ur Waived 10 0 - 19 mg/L   Creatinine, Urine Waived 200 10 - 300 mg/dL   Microalb/Creat Ratio <30 <30 mg/g      Assessment & Plan:   Problem List Items Addressed This Visit       Cardiovascular and Mediastinum   Hypertension    Chronic.  Controlled.  Continue with current medication regimen of Olmesartan 77m and Cardizem daily.  Refills sent today.  Labs ordered today.  Return to clinic in 6 months for reevaluation.  Call sooner if concerns arise.        Relevant Medications   diltiazem (CARDIZEM CD) 180 MG 24 hr capsule   rosuvastatin (CRESTOR) 5 MG tablet   Other Relevant Orders   Comp Met (CMET)     Endocrine   Controlled diabetes mellitus type 2 with complications ([place] [facility]    Chronic. Well controlled on Metformin 5063mBID.  Labs ordered today.  Last A1c was 7.8 in March.  Sugars have been running 118-160.  Has been more elevated since tearing her rotator cuff.  Will make further recommendations based on lab results.  Follow up in 6 months.  Call sooner if concerns arise.       Relevant Medications   metFORMIN (GLUCOPHAGE) 500 MG tablet   rosuvastatin (CRESTOR) 5 MG tablet   Other Relevant Orders   HgB A1c     Other   Mixed hyperlipidemia    Chronic.  Controlled.  Continue with current medication regimen of Crestor.  Labs ordered today.  Return to clinic in 6 months for reevaluation.  Call sooner if concerns arise.        Relevant Medications   diltiazem (CARDIZEM CD) 180 MG 24 hr capsule   rosuvastatin (CRESTOR) 5 MG tablet   Other Relevant Orders   Lipid Profile   Other Visit Diagnoses     Tear of rotator cuff, unspecified laterality, unspecified tear  extent, unspecified whether traumatic       Has surgery scheduled for August 29.  Having difficulty working and sleeping due to the pain. Will give refill of Ibuprofen 8004mo use PRN for pain.        Follow up plan: Return in about 6 months (around 08/05/2022) for HTN, HLD, DM2 FU.

## 2022-02-02 NOTE — Assessment & Plan Note (Signed)
Chronic.  Controlled.  Continue with current medication regimen of Crestor.  Labs ordered today.  Return to clinic in 6 months for reevaluation.  Call sooner if concerns arise.   

## 2022-02-03 ENCOUNTER — Ambulatory Visit: Payer: Federal, State, Local not specified - PPO | Admitting: Nurse Practitioner

## 2022-02-03 ENCOUNTER — Other Ambulatory Visit: Payer: Self-pay | Admitting: Surgery

## 2022-02-03 LAB — COMPREHENSIVE METABOLIC PANEL
ALT: 34 IU/L — ABNORMAL HIGH (ref 0–32)
AST: 20 IU/L (ref 0–40)
Albumin/Globulin Ratio: 1.9 (ref 1.2–2.2)
Albumin: 4.5 g/dL (ref 3.8–4.9)
Alkaline Phosphatase: 98 IU/L (ref 44–121)
BUN/Creatinine Ratio: 22 (ref 9–23)
BUN: 18 mg/dL (ref 6–24)
Bilirubin Total: <0.2 mg/dL (ref 0.0–1.2)
CO2: 24 mmol/L (ref 20–29)
Calcium: 9.5 mg/dL (ref 8.7–10.2)
Chloride: 105 mmol/L (ref 96–106)
Creatinine, Ser: 0.82 mg/dL (ref 0.57–1.00)
Globulin, Total: 2.4 g/dL (ref 1.5–4.5)
Glucose: 120 mg/dL — ABNORMAL HIGH (ref 70–99)
Potassium: 4.2 mmol/L (ref 3.5–5.2)
Sodium: 142 mmol/L (ref 134–144)
Total Protein: 6.9 g/dL (ref 6.0–8.5)
eGFR: 86 mL/min/{1.73_m2} (ref >59)

## 2022-02-03 LAB — LIPID PANEL
Chol/HDL Ratio: 3.4 ratio (ref 0.0–4.4)
Cholesterol, Total: 214 mg/dL — ABNORMAL HIGH (ref 100–199)
HDL: 63 mg/dL (ref >39)
LDL Chol Calc (NIH): 136 mg/dL — ABNORMAL HIGH (ref 0–99)
Triglycerides: 84 mg/dL (ref 0–149)
VLDL Cholesterol Cal: 15 mg/dL (ref 5–40)

## 2022-02-03 LAB — HEMOGLOBIN A1C
Est. average glucose Bld gHb Est-mCnc: 166 mg/dL
Hgb A1c MFr Bld: 7.4 % — ABNORMAL HIGH (ref 4.8–5.6)

## 2022-02-03 NOTE — Progress Notes (Signed)
Hi Carla Gill. It was was good to see you yesterday.  Your A1c improved from 7.8 to 7.4 which is great news.  Your cholesterol increased some so I recommend a low fat diet.  Continue with your current medication regimen.  Follow up as dicussed.

## 2022-02-23 ENCOUNTER — Encounter
Admission: RE | Admit: 2022-02-23 | Discharge: 2022-02-23 | Disposition: A | Discharge status: Home / Self Care | Payer: Federal, State, Local not specified - PPO | Source: Ambulatory Visit | Attending: Surgery | Admitting: Surgery

## 2022-02-23 VITALS — BP 122/55 | HR 63 | Temp 97.9°F | Resp 12 | Ht 62.0 in | Wt 207.0 lb

## 2022-02-23 DIAGNOSIS — E119 Type 2 diabetes mellitus without complications: Secondary | ICD-10-CM | POA: Diagnosis not present

## 2022-02-23 DIAGNOSIS — E118 Type 2 diabetes mellitus with unspecified complications: Secondary | ICD-10-CM | POA: Diagnosis not present

## 2022-02-23 DIAGNOSIS — I10 Essential (primary) hypertension: Secondary | ICD-10-CM | POA: Diagnosis not present

## 2022-02-23 DIAGNOSIS — Z01818 Encounter for other preprocedural examination: Secondary | ICD-10-CM | POA: Diagnosis not present

## 2022-02-23 DIAGNOSIS — Z01812 Encounter for preprocedural laboratory examination: Secondary | ICD-10-CM

## 2022-02-23 HISTORY — DX: Mixed hyperlipidemia: E78.2

## 2022-02-23 HISTORY — DX: Ventral hernia without obstruction or gangrene: K43.9

## 2022-02-23 HISTORY — DX: Obesity, unspecified: E66.9

## 2022-02-23 LAB — CBC
HCT: 37.8 % (ref 36.0–46.0)
Hemoglobin: 11.9 g/dL — ABNORMAL LOW (ref 12.0–15.0)
MCH: 29.9 pg (ref 26.0–34.0)
MCHC: 31.5 g/dL (ref 30.0–36.0)
MCV: 95 fL (ref 80.0–100.0)
Platelets: 225 10*3/uL (ref 150–400)
RBC: 3.98 MIL/uL (ref 3.87–5.11)
RDW: 13.9 % (ref 11.5–15.5)
WBC: 5.2 10*3/uL (ref 4.0–10.5)
nRBC: 0 % (ref 0.0–0.2)

## 2022-02-23 NOTE — Patient Instructions (Signed)
Your procedure is scheduled on: Tuesday, August 29 Report to the Registration Desk on the 1st floor of the [facility]. To find out your arrival time, please call [phone] between 1PM - 3PM on: Monday, August 28 If your arrival time is 6:00 am, do not arrive prior to that time as the [place] entrance doors do not open until 6:00 am.  REMEMBER: Instructions that are not followed completely may result in serious medical risk, up to and including death; or upon the discretion of your surgeon and anesthesiologist your surgery may need to be rescheduled.  Do not eat food after midnight the night before surgery.  No gum chewing, lozengers or hard candies.  You may however, drink water up to 2 hours before you are scheduled to arrive for your surgery. Do not drink anything within 2 hours of your scheduled arrival time.  In addition, your doctor has ordered for you to drink the provided  Gatorade G2 Drinking this carbohydrate drink up to two hours before surgery helps to reduce insulin resistance and improve patient outcomes. Please complete drinking 2 hours prior to scheduled arrival time.  TAKE THESE MEDICATIONS THE MORNING OF SURGERY WITH A SIP OF WATER:  Albuterol inhaler Diltiazem Rosuvastatin  Metformin - hold for 2 days PRIOR to surgery. Last day to take is Saturday, August 26. Resume AFTER surgery.  One week prior to surgery: starting August 22 Stop Anti-inflammatories (NSAIDS) such as Advil, Aleve, Ibuprofen, Motrin, Naproxen, Naprosyn and Aspirin based products such as Excedrin, Goodys Powder, BC Powder. Stop ANY OVER THE COUNTER supplements until after surgery. Stop multiple vitamins. You may however, continue to take Tylenol if needed for pain up until the day of surgery.  No Alcohol for 24 hours before or after surgery.  No Smoking including e-cigarettes for 24 hours prior to surgery.  No chewable tobacco products for at least 6 hours prior to surgery.  No nicotine  patches on the day of surgery.  Do not use any "recreational" drugs for at least a week prior to your surgery.  Please be advised that the combination of cocaine and anesthesia may have negative outcomes, up to and including death. If you test positive for cocaine, your surgery will be cancelled.  On the morning of surgery brush your teeth with toothpaste and water, you may rinse your mouth with mouthwash if you wish. Do not swallow any toothpaste or mouthwash.  Use CHG Soap as directed on instruction sheet.  Do not wear jewelry, make-up, hairpins, clips or nail polish.  Do not wear lotions, powders, or perfumes.   Do not shave body from the neck down 48 hours prior to surgery just in case you cut yourself which could leave a site for infection.  Also, freshly shaved skin may become irritated if using the CHG soap.  Contact lenses, hearing aids and dentures may not be worn into surgery.  Do not bring valuables to the hospital. [facility] is not responsible for any missing/lost belongings or valuables.   Notify your doctor if there is any change in your medical condition (cold, fever, infection).  Wear comfortable clothing (specific to your surgery type) to the hospital.  After surgery, you can help prevent lung complications by doing breathing exercises.  Take deep breaths and cough every 1-2 hours. Your doctor may order a device called an Incentive Spirometer to help you take deep breaths.  If you are being discharged the day of surgery, you will not be allowed to drive  home. You will need a responsible adult (18 years or older) to drive you home and stay with you that night.   If you are taking public transportation, you will need to have a responsible adult (18 years or older) with you. Please confirm with your physician that it is acceptable to use public transportation.   Please call the [facility] at [phone] if you have any questions about these  instructions.  Surgery Visitation Policy:  Patients undergoing a surgery or procedure may have two family members or support persons with them as long as the person is not COVID-19 positive or experiencing its symptoms.   Preparing for Surgery with [place] (CHG) Soap    Before surgery, you can play an important role by reducing the number of germs on your skin.  CHG (Chlorhexidine gluconate) soap is an antiseptic cleanser which kills germs and bonds with the skin to continue killing germs even after washing.  Please do not use if you have an allergy to CHG or antibacterial soaps. If your skin becomes reddened/irritated stop using the CHG.  1. Shower the NIGHT BEFORE SURGERY and the MORNING OF SURGERY with CHG soap.  2. If you choose to wash your hair, wash your hair first as usual with your normal shampoo.  3. After shampooing, rinse your hair and body thoroughly to remove the shampoo.  4. Use CHG as you would any other liquid soap. You can apply CHG directly to the skin and wash gently with a scrungie or a clean washcloth.  5. Apply the CHG soap to your body only from the neck down. Do not use on open wounds or open sores. Avoid contact with your eyes, ears, mouth, and genitals (private parts). Wash face and genitals (private parts) with your normal soap.  6. Wash thoroughly, paying special attention to the area where your surgery will be performed.  7. Thoroughly rinse your body with warm water.  8. Do not shower/wash with your normal soap after using and rinsing off the CHG soap.  9. Pat yourself dry with a clean towel.  10. Wear clean pajamas to bed the night before surgery.  12. Place clean sheets on your bed the night of your first shower and do not sleep with pets.  13. Shower again with the CHG soap on the day of surgery prior to arriving at the hospital.  14. Do not apply any deodorants/lotions/powders.  15. Please wear clean clothes to the hospital.

## 2022-02-25 DIAGNOSIS — M75121 Complete rotator cuff tear or rupture of right shoulder, not specified as traumatic: Secondary | ICD-10-CM | POA: Diagnosis not present

## 2022-03-02 MED ORDER — CEFAZOLIN SODIUM-DEXTROSE 2-4 GM/100ML-% IV SOLN
2.0000 g | INTRAVENOUS | Status: AC
  Administered 2022-03-03: 2 g via INTRAVENOUS

## 2022-03-02 MED ORDER — FAMOTIDINE 20 MG PO TABS: 20.0000 mg | ORAL_TABLET | Freq: Once | ORAL | Status: AC

## 2022-03-02 MED ORDER — CHLORHEXIDINE GLUCONATE 0.12 % MT SOLN: 15.0000 mL | Freq: Once | OROMUCOSAL | Status: AC

## 2022-03-02 MED ORDER — SODIUM CHLORIDE 0.9 % IV SOLN: INTRAVENOUS | Status: DC

## 2022-03-02 MED ORDER — ORAL CARE MOUTH RINSE: 15.0000 mL | Freq: Once | OROMUCOSAL | Status: AC

## 2022-03-03 ENCOUNTER — Ambulatory Visit: Payer: Federal, State, Local not specified - PPO

## 2022-03-03 ENCOUNTER — Ambulatory Visit: Payer: Federal, State, Local not specified - PPO | Admitting: Certified Registered"

## 2022-03-03 ENCOUNTER — Ambulatory Visit
Admission: RE | Admit: 2022-03-03 | Discharge: 2022-03-03 | Disposition: A | Discharge status: Home / Self Care | Payer: Federal, State, Local not specified - PPO | Source: Ambulatory Visit | Attending: Surgery | Admitting: Surgery

## 2022-03-03 ENCOUNTER — Encounter: Payer: Self-pay | Admitting: Surgery

## 2022-03-03 ENCOUNTER — Encounter
Admission: RE | Disposition: A | Discharge status: Home / Self Care | Payer: Self-pay | Source: Ambulatory Visit | Attending: Surgery

## 2022-03-03 ENCOUNTER — Other Ambulatory Visit: Payer: Self-pay

## 2022-03-03 ENCOUNTER — Ambulatory Visit: Payer: Federal, State, Local not specified - PPO | Admitting: Urgent Care

## 2022-03-03 DIAGNOSIS — M7521 Bicipital tendinitis, right shoulder: Secondary | ICD-10-CM | POA: Diagnosis not present

## 2022-03-03 DIAGNOSIS — E119 Type 2 diabetes mellitus without complications: Secondary | ICD-10-CM | POA: Diagnosis not present

## 2022-03-03 DIAGNOSIS — I10 Essential (primary) hypertension: Secondary | ICD-10-CM | POA: Insufficient documentation

## 2022-03-03 DIAGNOSIS — Z79899 Other long term (current) drug therapy: Secondary | ICD-10-CM | POA: Diagnosis not present

## 2022-03-03 DIAGNOSIS — F17200 Nicotine dependence, unspecified, uncomplicated: Secondary | ICD-10-CM | POA: Insufficient documentation

## 2022-03-03 DIAGNOSIS — M7581 Other shoulder lesions, right shoulder: Secondary | ICD-10-CM | POA: Diagnosis not present

## 2022-03-03 DIAGNOSIS — E118 Type 2 diabetes mellitus with unspecified complications: Secondary | ICD-10-CM

## 2022-03-03 DIAGNOSIS — Z7984 Long term (current) use of oral hypoglycemic drugs: Secondary | ICD-10-CM | POA: Diagnosis not present

## 2022-03-03 DIAGNOSIS — G8918 Other acute postprocedural pain: Secondary | ICD-10-CM | POA: Diagnosis not present

## 2022-03-03 DIAGNOSIS — M24111 Other articular cartilage disorders, right shoulder: Secondary | ICD-10-CM | POA: Diagnosis not present

## 2022-03-03 DIAGNOSIS — J45909 Unspecified asthma, uncomplicated: Secondary | ICD-10-CM | POA: Insufficient documentation

## 2022-03-03 DIAGNOSIS — G4730 Sleep apnea, unspecified: Secondary | ICD-10-CM | POA: Insufficient documentation

## 2022-03-03 DIAGNOSIS — M75101 Unspecified rotator cuff tear or rupture of right shoulder, not specified as traumatic: Secondary | ICD-10-CM | POA: Diagnosis not present

## 2022-03-03 DIAGNOSIS — M75121 Complete rotator cuff tear or rupture of right shoulder, not specified as traumatic: Secondary | ICD-10-CM | POA: Diagnosis not present

## 2022-03-03 DIAGNOSIS — Z01812 Encounter for preprocedural laboratory examination: Secondary | ICD-10-CM

## 2022-03-03 DIAGNOSIS — M7541 Impingement syndrome of right shoulder: Secondary | ICD-10-CM | POA: Diagnosis not present

## 2022-03-03 HISTORY — PX: SHOULDER ARTHROSCOPY WITH SUBACROMIAL DECOMPRESSION, ROTATOR CUFF REPAIR AND BICEP TENDON REPAIR: SHX5687

## 2022-03-03 LAB — GLUCOSE, CAPILLARY
Glucose-Capillary: 160 mg/dL — ABNORMAL HIGH (ref 70–99)
Glucose-Capillary: 168 mg/dL — ABNORMAL HIGH (ref 70–99)

## 2022-03-03 LAB — POCT PREGNANCY, URINE: Preg Test, Ur: NEGATIVE

## 2022-03-03 SURGERY — SHOULDER ARTHROSCOPY WITH SUBACROMIAL DECOMPRESSION, ROTATOR CUFF REPAIR AND BICEP TENDON REPAIR
Anesthesia: General | Site: Shoulder | Laterality: Right

## 2022-03-03 MED ORDER — BUPIVACAINE HCL (PF) 0.5 % IJ SOLN
INTRAMUSCULAR | Status: DC | PRN
  Administered 2022-03-03: 10 mL via PERINEURAL

## 2022-03-03 MED ORDER — BUPIVACAINE LIPOSOME 1.3 % IJ SUSP
INTRAMUSCULAR | Status: AC
  Filled 2022-03-03: qty 20

## 2022-03-03 MED ORDER — KETOROLAC TROMETHAMINE 30 MG/ML IJ SOLN
INTRAMUSCULAR | Status: AC
  Filled 2022-03-03: qty 1

## 2022-03-03 MED ORDER — ONDANSETRON HCL 4 MG PO TABS: 4.0000 mg | ORAL_TABLET | Freq: Four times a day (QID) | ORAL | Status: DC | PRN

## 2022-03-03 MED ORDER — EPHEDRINE SULFATE (PRESSORS) 50 MG/ML IJ SOLN
INTRAMUSCULAR | Status: DC | PRN
  Administered 2022-03-03 (×3): 5 mg via INTRAVENOUS

## 2022-03-03 MED ORDER — ONDANSETRON HCL 4 MG/2ML IJ SOLN
INTRAMUSCULAR | Status: DC | PRN
  Administered 2022-03-03: 4 mg via INTRAVENOUS

## 2022-03-03 MED ORDER — ACETAMINOPHEN 10 MG/ML IV SOLN
INTRAVENOUS | Status: AC
  Filled 2022-03-03: qty 100

## 2022-03-03 MED ORDER — FENTANYL CITRATE PF 50 MCG/ML IJ SOSY: 50.0000 ug | PREFILLED_SYRINGE | Freq: Once | INTRAMUSCULAR | Status: AC

## 2022-03-03 MED ORDER — LIDOCAINE HCL (PF) 1 % IJ SOLN
INTRAMUSCULAR | Status: DC | PRN
  Administered 2022-03-03: 3 mL via SUBCUTANEOUS

## 2022-03-03 MED ORDER — ROCURONIUM BROMIDE 100 MG/10ML IV SOLN
INTRAVENOUS | Status: DC | PRN
  Administered 2022-03-03: 50 mg via INTRAVENOUS

## 2022-03-03 MED ORDER — PROPOFOL 10 MG/ML IV BOLUS
INTRAVENOUS | Status: AC
  Filled 2022-03-03: qty 20

## 2022-03-03 MED ORDER — PHENYLEPHRINE HCL-NACL 20-0.9 MG/250ML-% IV SOLN
INTRAVENOUS | Status: DC | PRN
  Administered 2022-03-03: 25 ug/min via INTRAVENOUS

## 2022-03-03 MED ORDER — LIDOCAINE HCL (CARDIAC) PF 100 MG/5ML IV SOSY
PREFILLED_SYRINGE | INTRAVENOUS | Status: DC | PRN
  Administered 2022-03-03: 100 mg via INTRAVENOUS

## 2022-03-03 MED ORDER — OXYCODONE HCL 5 MG PO TABS
ORAL_TABLET | ORAL | Status: AC
  Filled 2022-03-03: qty 1

## 2022-03-03 MED ORDER — METOCLOPRAMIDE HCL 5 MG/ML IJ SOLN: 5.0000 mg | Freq: Three times a day (TID) | INTRAMUSCULAR | Status: DC | PRN

## 2022-03-03 MED ORDER — SUGAMMADEX SODIUM 200 MG/2ML IV SOLN
INTRAVENOUS | Status: DC | PRN
  Administered 2022-03-03: 200 mg via INTRAVENOUS

## 2022-03-03 MED ORDER — PROPOFOL 10 MG/ML IV BOLUS
INTRAVENOUS | Status: DC | PRN
  Administered 2022-03-03: 150 mg via INTRAVENOUS

## 2022-03-03 MED ORDER — OXYCODONE HCL 5 MG PO TABS: 5.0000 mg | ORAL_TABLET | ORAL | Status: DC | PRN

## 2022-03-03 MED ORDER — FAMOTIDINE 20 MG PO TABS
ORAL_TABLET | ORAL | Status: AC
  Administered 2022-03-03: 20 mg via ORAL
  Filled 2022-03-03: qty 1

## 2022-03-03 MED ORDER — OXYCODONE HCL 5 MG PO TABS: 5.0000 mg | ORAL_TABLET | ORAL | 0 refills | Status: DC | PRN

## 2022-03-03 MED ORDER — ACETAMINOPHEN 10 MG/ML IV SOLN
INTRAVENOUS | Status: DC | PRN
  Administered 2022-03-03: 1000 mg via INTRAVENOUS

## 2022-03-03 MED ORDER — CHLORHEXIDINE GLUCONATE 0.12 % MT SOLN
OROMUCOSAL | Status: AC
  Administered 2022-03-03: 15 mL via OROMUCOSAL
  Filled 2022-03-03: qty 15

## 2022-03-03 MED ORDER — BUPIVACAINE LIPOSOME 1.3 % IJ SUSP
INTRAMUSCULAR | Status: DC | PRN
  Administered 2022-03-03: 20 mL via PERINEURAL

## 2022-03-03 MED ORDER — PROMETHAZINE HCL 25 MG/ML IJ SOLN: 6.2500 mg | INTRAMUSCULAR | Status: DC | PRN

## 2022-03-03 MED ORDER — FENTANYL CITRATE (PF) 100 MCG/2ML IJ SOLN
INTRAMUSCULAR | Status: AC
  Filled 2022-03-03: qty 2

## 2022-03-03 MED ORDER — SODIUM CHLORIDE 0.9 % IV SOLN: INTRAVENOUS | Status: DC

## 2022-03-03 MED ORDER — MIDAZOLAM HCL 2 MG/2ML IJ SOLN
INTRAMUSCULAR | Status: AC
  Administered 2022-03-03: 1 mg via INTRAVENOUS
  Filled 2022-03-03: qty 2

## 2022-03-03 MED ORDER — BUPIVACAINE-EPINEPHRINE 0.5% -1:200000 IJ SOLN
INTRAMUSCULAR | Status: DC | PRN
  Administered 2022-03-03: 30 mL

## 2022-03-03 MED ORDER — EPINEPHRINE PF 1 MG/ML IJ SOLN
INTRAMUSCULAR | Status: AC
  Filled 2022-03-03: qty 2

## 2022-03-03 MED ORDER — METOCLOPRAMIDE HCL 10 MG PO TABS: 5.0000 mg | ORAL_TABLET | Freq: Three times a day (TID) | ORAL | Status: DC | PRN

## 2022-03-03 MED ORDER — BUPIVACAINE-EPINEPHRINE (PF) 0.5% -1:200000 IJ SOLN
INTRAMUSCULAR | Status: AC
  Filled 2022-03-03: qty 30

## 2022-03-03 MED ORDER — BUPIVACAINE HCL (PF) 0.5 % IJ SOLN
INTRAMUSCULAR | Status: AC
  Filled 2022-03-03: qty 10

## 2022-03-03 MED ORDER — FENTANYL CITRATE (PF) 100 MCG/2ML IJ SOLN
INTRAMUSCULAR | Status: DC | PRN
  Administered 2022-03-03 (×2): 50 ug via INTRAVENOUS

## 2022-03-03 MED ORDER — FENTANYL CITRATE PF 50 MCG/ML IJ SOSY
PREFILLED_SYRINGE | INTRAMUSCULAR | Status: AC
  Administered 2022-03-03: 50 ug via INTRAVENOUS
  Filled 2022-03-03: qty 1

## 2022-03-03 MED ORDER — ONDANSETRON HCL 4 MG/2ML IJ SOLN: 4.0000 mg | Freq: Four times a day (QID) | INTRAMUSCULAR | Status: DC | PRN

## 2022-03-03 MED ORDER — FENTANYL CITRATE (PF) 100 MCG/2ML IJ SOLN: 25.0000 ug | INTRAMUSCULAR | Status: DC | PRN

## 2022-03-03 MED ORDER — KETOROLAC TROMETHAMINE 30 MG/ML IJ SOLN
30.0000 mg | Freq: Once | INTRAMUSCULAR | Status: AC
  Administered 2022-03-03: 30 mg via INTRAVENOUS

## 2022-03-03 MED ORDER — LIDOCAINE HCL (PF) 1 % IJ SOLN
INTRAMUSCULAR | Status: AC
  Filled 2022-03-03: qty 5

## 2022-03-03 MED ORDER — DEXAMETHASONE SODIUM PHOSPHATE 10 MG/ML IJ SOLN
INTRAMUSCULAR | Status: DC | PRN
  Administered 2022-03-03: 10 mg via INTRAVENOUS

## 2022-03-03 MED ORDER — CEFAZOLIN SODIUM-DEXTROSE 2-4 GM/100ML-% IV SOLN
INTRAVENOUS | Status: AC
  Filled 2022-03-03: qty 100

## 2022-03-03 MED ORDER — LACTATED RINGERS IV SOLN
INTRAVENOUS | Status: DC | PRN
  Administered 2022-03-03: 3001 mL

## 2022-03-03 MED ORDER — PHENYLEPHRINE HCL (PRESSORS) 10 MG/ML IV SOLN
INTRAVENOUS | Status: DC | PRN
  Administered 2022-03-03 (×2): 80 ug via INTRAVENOUS
  Administered 2022-03-03: 160 ug via INTRAVENOUS
  Administered 2022-03-03: 80 ug via INTRAVENOUS
  Administered 2022-03-03: 160 ug via INTRAVENOUS

## 2022-03-03 MED ORDER — OXYCODONE HCL 5 MG PO TABS
5.0000 mg | ORAL_TABLET | Freq: Once | ORAL | Status: AC
  Administered 2022-03-03: 5 mg via ORAL

## 2022-03-03 MED ORDER — MIDAZOLAM HCL 2 MG/2ML IJ SOLN: 1.0000 mg | Freq: Once | INTRAMUSCULAR | Status: AC

## 2022-03-03 SURGICAL SUPPLY — 53 items
ANCHOR ALL-SUT Q-FIX 2.8 (Anchor) IMPLANT
ANCHOR HEALICOIL REGEN 5.5 (Anchor) IMPLANT
BIT DRILL JUGRKNT W/NDL BIT2.9 (DRILL) IMPLANT
BLADE FULL RADIUS 3.5 (BLADE) ×1 IMPLANT
BUR ACROMIONIZER 4.0 (BURR) ×1 IMPLANT
CANNULA SHAVER 8MMX76MM (CANNULA) ×1 IMPLANT
CHLORAPREP W/TINT 26 (MISCELLANEOUS) ×1 IMPLANT
COVER MAYO STAND REUSABLE (DRAPES) ×1 IMPLANT
DILATOR 5.5 THREADED HEALICOIL (MISCELLANEOUS) IMPLANT
DRILL JUGGERKNOT W/NDL BIT 2.9 (DRILL)
ELECT CAUTERY BLADE 6.4 (BLADE) ×1 IMPLANT
ELECT REM PT RETURN 9FT ADLT (ELECTROSURGICAL) ×1
ELECTRODE REM PT RTRN 9FT ADLT (ELECTROSURGICAL) ×1 IMPLANT
GAUZE SPONGE 4X4 12PLY STRL (GAUZE/BANDAGES/DRESSINGS) ×1 IMPLANT
GAUZE XEROFORM 1X8 LF (GAUZE/BANDAGES/DRESSINGS) ×1 IMPLANT
GLOVE BIO SURGEON STRL SZ7.5 (GLOVE) ×2 IMPLANT
GLOVE BIO SURGEON STRL SZ8 (GLOVE) ×2 IMPLANT
GLOVE BIOGEL PI IND STRL 8 (GLOVE) ×1 IMPLANT
GLOVE BIOGEL PI INDICATOR 8 (GLOVE) ×1
GLOVE SURG UNDER LTX SZ8 (GLOVE) ×1 IMPLANT
GOWN STRL REUS W/ TWL LRG LVL3 (GOWN DISPOSABLE) ×1 IMPLANT
GOWN STRL REUS W/ TWL XL LVL3 (GOWN DISPOSABLE) ×1 IMPLANT
GOWN STRL REUS W/TWL LRG LVL3 (GOWN DISPOSABLE) ×1
GOWN STRL REUS W/TWL XL LVL3 (GOWN DISPOSABLE) ×1
GRAFT TISS 40X70 3 THK DERM (Tissue) IMPLANT
GRASPER SUT 15 45D LOW PRO (SUTURE) IMPLANT
IV LACTATED RINGER IRRG 3000ML (IV SOLUTION) ×2
IV LR IRRIG 3000ML ARTHROMATIC (IV SOLUTION) ×2 IMPLANT
KIT CANNULA 8X76-LX IN CANNULA (CANNULA) IMPLANT
KIT SUTURE 2.8 Q-FIX DISP (MISCELLANEOUS) IMPLANT
MANIFOLD NEPTUNE II (INSTRUMENTS) ×2 IMPLANT
MASK FACE SPIDER DISP (MASK) ×1 IMPLANT
MAT ABSORB  FLUID 56X50 GRAY (MISCELLANEOUS) ×1
MAT ABSORB FLUID 56X50 GRAY (MISCELLANEOUS) ×1 IMPLANT
PACK ARTHROSCOPY SHOULDER (MISCELLANEOUS) ×1 IMPLANT
PAD ABD DERMACEA PRESS 5X9 (GAUZE/BANDAGES/DRESSINGS) ×2 IMPLANT
PASSER SUT FIRSTPASS SELF (INSTRUMENTS) IMPLANT
SLING ARM LRG DEEP (SOFTGOODS) ×1 IMPLANT
SLING ULTRA II LG (MISCELLANEOUS) ×1 IMPLANT
SPONGE T-LAP 18X18 ~~LOC~~+RFID (SPONGE) ×1 IMPLANT
STAPLER SKIN PROX 35W (STAPLE) ×1 IMPLANT
STRAP SAFETY 5IN WIDE (MISCELLANEOUS) ×1 IMPLANT
SUT ETHIBOND 0 MO6 C/R (SUTURE) ×1 IMPLANT
SUT ULTRABRAID 2 COBRAID 38 (SUTURE) IMPLANT
SUT VIC AB 2-0 CT1 27 (SUTURE) ×2
SUT VIC AB 2-0 CT1 TAPERPNT 27 (SUTURE) ×2 IMPLANT
TAPE MICROFOAM 4IN (TAPE) ×1 IMPLANT
TISSUE ARTHOFLEX THICK 3MM (Tissue) ×1 IMPLANT
TRAP FLUID SMOKE EVACUATOR (MISCELLANEOUS) ×1 IMPLANT
TUBING CONNECTING 10 (TUBING) ×1 IMPLANT
TUBING INFLOW SET DBFLO PUMP (TUBING) ×1 IMPLANT
WAND WEREWOLF FLOW 90D (MISCELLANEOUS) ×1 IMPLANT
WATER STERILE IRR 500ML POUR (IV SOLUTION) ×1 IMPLANT

## 2022-03-03 NOTE — Discharge Instructions (Addendum)
Orthopedic discharge instructions: Keep dressing dry and intact.  May shower after dressing changed on post-op day #4 (Saturday).  Cover staples with Band-Aids after drying off. Apply ice frequently to shoulder. Take ibuprofen 600-800 mg TID with meals for 5-7 days, then as necessary. Take oxycodone as prescribed when needed.  May supplement with ES Tylenol if necessary. Keep shoulder immobilizer on at all times except may remove for bathing purposes.  Follow-up in 10-14 days or as scheduled.  AMBULATORY SURGERY  DISCHARGE INSTRUCTIONS   The drugs that you were given will stay in your system until tomorrow so for the next 24 hours you should not:  Drive an automobile Make any legal decisions Drink any alcoholic beverage   You may resume regular meals tomorrow.  Today it is better to start with liquids and gradually work up to solid foods.  You may eat anything you prefer, but it is better to start with liquids, then soup and crackers, and gradually work up to solid foods.   Please notify your doctor immediately if you have any unusual bleeding, trouble breathing, redness and pain at the surgery site, drainage, fever, or pain not relieved by medication.    Additional Instructions:        Please contact your physician with any problems or Same Day Surgery at [phone], Monday through Friday 6 am to 4 pm, or [place] at [facility] number at [phone]. AMBULATORY SURGERY  DISCHARGE INSTRUCTIONS   The drugs that you were given will stay in your system until tomorrow so for the next 24 hours you should not:  Drive an automobile Make any legal decisions Drink any alcoholic beverage   You may resume regular meals tomorrow.  Today it is better to start with liquids and gradually work up to solid foods.  You may eat anything you prefer, but it is better to start with liquids, then soup and crackers, and gradually work up to solid foods.   Please notify your  doctor immediately if you have any unusual bleeding, trouble breathing, redness and pain at the surgery site, drainage, fever, or pain not relieved by medication.    Additional Instructions:  PLEASE LEAVE GREEN ARMBAND ON FOR 4 DAYS    Please contact your physician with any problems or Same Day Surgery at [phone], Monday through Friday 6 am to 4 pm, or [place] at [facility] number at [phone].     Interscalene Nerve Block with Exparel   For your surgery you have received an Interscalene Nerve Block with Exparel. Nerve Blocks affect many types of nerves, including nerves that control movement, pain and normal sensation.  You may experience feelings such as numbness, tingling, heaviness, weakness or the inability to move your arm or the feeling or sensation that your arm has "fallen asleep". A nerve block with Exparel can last up to 5 days.  Usually the weakness wears off first.  The tingling and heaviness usually wear off next.  Finally you may start to notice pain.  Keep in mind that this may occur in any order.  Once a nerve block starts to wear off it is usually completely gone within 60 minutes. ISNB may cause mild shortness of breath, a hoarse voice, blurry vision, unequal pupils, or drooping of the face on the same side as the nerve block.  These symptoms will usually resolve with the numbness.  Very rarely the procedure itself can cause mild seizures. If needed, your surgeon will give you a prescription for  pain medication.  It will take about 60 minutes for the oral pain medication to become fully effective.  So, it is recommended that you start taking this medication before the nerve block first begins to wear off, or when you first begin to feel discomfort. Take your pain medication only as prescribed.  Pain medication can cause sedation and decrease your breathing if you take more than you need for the level of pain that you have. Nausea is a common side effect of many  pain medications.  You may want to eat something before taking your pain medicine to prevent nausea. After an Interscalene nerve block, you cannot feel pain, pressure or extremes in temperature in the effected arm.  Because your arm is numb it is at an increased risk for injury.  To decrease the possibility of injury, please practice the following:  While you are awake change the position of your arm frequently to prevent too much pressure on any one area for prolonged periods of time.  If you have a cast or tight dressing, check the color or your fingers every couple of hours.  Call your surgeon with the appearance of any discoloration (white or blue). If you are given a sling to wear before you go home, please wear it  at all times until the block has completely worn off.  Do not get up at night without your sling. Please contact [facility] or your surgeon if you do not begin to regain sensation after 7 days from the surgery.  Anesthesia may be contacted by calling the Same Day Surgery Department, Mon. through Fri., 6 am to 4 pm at [phone].   If you experience any other problems or concerns, please contact your surgeon's office. If you experience severe or prolonged shortness of breath go to the nearest emergency department.   SHOULDER SLING IMMOBILIZER   VIDEO Slingshot 2 Shoulder Brace Application - YouTube ---[email or web address]  INSTRUCTIONS While supporting the injured arm, slide the forearm into the sling. Wrap the adjustable shoulder strap around the neck and shoulders and attach the strap end to the sling using  the "alligator strap tab."  Adjust the shoulder strap to the required length. Position the shoulder pad behind the neck. To secure the shoulder pad location (optional), pull the shoulder strap away from the shoulder pad, unfold the hook material on the top of the pad, then press the shoulder strap back onto the hook material to secure the pad in  place. Attach the closure strap across the open top of the sling. Position the strap so that it holds the arm securely in the sling. Next, attach the thumb strap to the open end of the sling between the thumb and fingers. After sling has been fit, it may be easily removed and reapplied using the quick release buckle on shoulder strap. If a neutral pillow or 15 abduction pillow is included, place the pillow at the waistline. Attach the sling to the pillow, lining up hook material on the pillow with the loop on sling. Adjust the waist strap to fit.  If waist strap is too long, cut it to fit. Use the small piece of double sided hook material (located on top of the pillow) to secure the strap end. Place the double sided hook material on the inside of the cut strap end and secure it to the waist strap.     If no pillow is included, attach the waist strap to the sling and adjust to fit.  Washing Instructions: Straps and sling must be removed and cleaned regularly depending on your activity level and perspiration. Hand wash straps and sling in cold water with mild detergent, rinse, air dry

## 2022-03-03 NOTE — Op Note (Addendum)
03/03/2022  12:46 PM  Patient:   Carla Gill  Pre-Op Diagnosis:   Impingement/tendinopathy with massive rotator cuff tear and biceps tendinopathy, right shoulder.  Post-Op Diagnosis:   Impingement/tendinopathy with massive rotator cuff tear, degenerative labral fraying, and biceps tendinopathy, right shoulder.  Procedure:   Extensive arthroscopic debridement, arthroscopic subacromial decompression, and mini-open rotator cuff repair using dermal allograft, right shoulder.  Anesthesia:   General endotracheal with interscalene block using Exparel placed preoperatively by the anesthesiologist.  Surgeon:   Pascal Lux, MD  Assistant:   Cameron Proud, PA-C  Findings:   As above. There was a massive rotator cuff tear involving the entire supraspinatus and infraspinatus tendon insertions with retraction to the glenoid rim. The subscapularis tendon demonstrated a longitudinal split tear without compromise of the footprint. The remainder the rotator cuff was in satisfactory condition. There was moderate labral fraying anteriorly, superior, and posterior superiorly without frank detachment from the glenoid rim. The biceps tendon demonstrated significant tendinopathic changes with partial-thickness tearing. The articular surfaces of the glenoid and humerus both were in satisfactory condition.  Complications:   None  Fluids:   700 cc  Estimated blood loss:   20 cc  Tourniquet time:   None  Drains:   None  Closure:   Staples      Brief clinical note:   The patient is a 51 year old female with a long history of progressively worsening pain and weakness of her right shoulder. The patient's symptoms have progressed despite medications, activity modification, etc. The patient's history and examination are consistent with impingement/tendinopathy with a rotator cuff tear. These findings were confirmed by MRI scan. The patient presents at this time for definitive management of these shoulder  symptoms.  Procedure:   The patient underwent placement of an interscalene block using Exparel by the anesthesiologist in the preoperative holding area before being brought into the operating room and lain in the supine position. The patient then underwent general endotracheal intubation and anesthesia before being repositioned in the beach chair position using the beach chair positioner. The right shoulder and upper extremity were prepped with ChloraPrep solution before being draped sterilely. Preoperative antibiotics were administered. A timeout was performed to confirm the proper surgical site before the expected portal sites and incision site were injected with 0.5% Sensorcaine with epinephrine.   A posterior portal was created and the glenohumeral joint thoroughly inspected with the findings as described above. An anterior portal was created using an outside-in technique. The labrum and rotator cuff were further probed, again confirming the above-noted findings. Areas of labral fraying were debrided back to stable margins using the full-radius resector. The torn margins of the rotator cuff also were debrided back to stable margins using the full-radius resector. Finally, areas of significant synovitis were debrided back to stable margins using the full-radius resector. The ArthroCare wand was inserted and used to release the biceps tendon from its labral anchor. It also was used to obtain hemostasis as well as to "anneal" the labrum superiorly and anteriorly. The instruments were removed from the joint after suctioning the excess fluid.  The camera was repositioned through the posterior portal into the subacromial space. A separate lateral portal was created using an outside-in technique. The 3.5 mm full-radius resector was introduced and used to perform a subtotal bursectomy. The ArthroCare wand was then inserted and used to remove the periosteal tissue off the undersurface of the anterior third of the  acromion as well as to recess the coracoacromial ligament from its  attachment along the anterior and lateral margins of the acromion. The 4.0 mm acromionizing bur was introduced and used to complete the decompression by removing the undersurface of the anterior third of the acromion. The full radius resector was reintroduced to remove any residual bony debris before the ArthroCare wand was reintroduced to obtain hemostasis. The instruments were then removed from the subacromial space after suctioning the excess fluid.  An approximately 4-5 cm incision was made over the anterolateral aspect of the shoulder beginning at the anterolateral corner of the acromion and extending distally in line with the bicipital groove. This incision was carried down through the subcutaneous tissues to expose the deltoid fascia. The raphae between the anterior and middle thirds was identified and this plane developed to provide access into the subacromial space. Additional bursal tissues were debrided sharply using Metzenbaum scissors. The rotator cuff tear was readily identified. The margins were debrided sharply with a #15 blade. The tear pattern was consistent with an inverted T. The anterior and posterior flaps were released from adjacent scar tissue and mobilized fully to enable them to be reapproximated to the articular margin. The exposed greater tuberosity roughened with a rongeur. The tear was repaired using three [employer] 2.9 mm Q-Fix anchors. Each of the sutures were passed through the torn margin of the rotator cuff and tied securely.   Given the poor quality of the tissue, as well as the inability to fully mobilize the torn portion of the rotator cuff to cover the greater tuberosity, it was felt best to reinforce this repair using a dermal allograft. An appropriate sized dermal allograft was selected and cut to the appropriate size. Each of the sets of sutures from the anchors were passed through the graft and  again tied securely. These sutures were then brought back laterally and secured using two [place] knotless RegeneSorb anchors to create a two-layer closure. An apparent watertight closure was obtained. The addition of this dermal allograft raised the complexity of this case. In addition, incorporating the dermal allograft into the procedure required an additional 30-40 minutes of surgical time.  The wound was copiously irrigated with sterile saline solution before the deltoid raphae was reapproximated using 2-0 Vicryl interrupted sutures. The subcutaneous tissues were closed in two layers using 2-0 Vicryl interrupted sutures before the skin was closed using staples. The portal sites also were closed using staples. A sterile bulky dressing was applied to the shoulder before the arm was placed into a shoulder immobilizer. The patient was then awakened, extubated, and returned to the recovery room in satisfactory condition after tolerating the procedure well.

## 2022-03-03 NOTE — Anesthesia Procedure Notes (Signed)
Procedure Name: Intubation Date/Time: 03/03/2022 10:24 AM  Performed by: Chanetta Marshall, CRNAPre-anesthesia Checklist: Patient identified, Emergency Drugs available, Suction available and Patient being monitored Patient Re-evaluated:Patient Re-evaluated prior to induction Oxygen Delivery Method: Circle system utilized Preoxygenation: Pre-oxygenation with 100% oxygen Induction Type: IV induction Ventilation: Mask ventilation without difficulty Laryngoscope Size: McGraph and 3 Grade View: Grade II Tube type: Oral Number of attempts: 1 Airway Equipment and Method: Oral airway Placement Confirmation: ETT inserted through vocal cords under direct vision, positive ETCO2, breath sounds checked- equal and bilateral and CO2 detector Secured at: 21 cm Tube secured with: Tape Dental Injury: Teeth and Oropharynx as per pre-operative assessment

## 2022-03-03 NOTE — Anesthesia Preprocedure Evaluation (Signed)
Anesthesia Evaluation  Patient identified by MRN, date of birth, ID band Patient awake    Reviewed: Allergy & Precautions, NPO status , Patient's Chart, lab work & pertinent test results  History of Anesthesia Complications Negative for: history of anesthetic complications  Airway Mallampati: III  TM Distance: >3 FB Neck ROM: full    Dental  (+) Chipped, Dental Advidsory Given   Pulmonary neg shortness of breath, asthma , sleep apnea , neg recent URI, Current Smoker and Patient abstained from smoking., former smoker,    Pulmonary exam normal        Cardiovascular Exercise Tolerance: Good hypertension, (-) angina(-) Past MI and (-) DOE Normal cardiovascular exam(-) dysrhythmias (-) Valvular Problems/Murmurs     Neuro/Psych negative neurological ROS  negative psych ROS   GI/Hepatic negative GI ROS, Neg liver ROS, neg GERD  ,  Endo/Other  diabetes, Type 2  Renal/GU      Musculoskeletal  (+) Arthritis ,   Abdominal   Peds  Hematology negative hematology ROS (+)   Anesthesia Other Findings Past Medical History: No date: Arthritis No date: Asthma     Comment:  well controlled No date: Diabetes ([place]) No date: Environmental and seasonal allergies No date: Hypertension  Past Surgical History: 1990: [place]: CHOLECYSTECTOMY 07/14/2021: COLONOSCOPY WITH PROPOFOL; N/A     Comment:  Procedure: COLONOSCOPY WITH PROPOFOL;  Surgeon: Jonathon Bellows, MD;  Location: [facility] END[facility]COPY;  Service:               Gastroenterology;  Laterality: N/A; No date: DILATION AND CURETTAGE OF UTERUS No date: FOOT SURGERY No date: TUBAL LIGATION  BMI    Body Mass Index: 37.38 kg/m      Reproductive/Obstetrics negative OB ROS                             Anesthesia Physical  Anesthesia Plan  ASA: 3  Anesthesia Plan: General   Post-op Pain Management: Regional block*   Induction:  Intravenous  PONV Risk Score and Plan: Ondansetron, Dexamethasone, Midazolam and Treatment may vary due to age or medical condition  Airway Management Planned: Oral ETT  Additional Equipment:   Intra-op Plan:   Post-operative Plan: Extubation in OR  Informed Consent: I have reviewed the patients History and Physical, chart, labs and discussed the procedure including the risks, benefits and alternatives for the proposed anesthesia with the patient or authorized representative who has indicated his/her understanding and acceptance.     Dental Advisory Given  Plan Discussed with: Anesthesiologist, CRNA and Surgeon  Anesthesia Plan Comments: (Patient consented for risks of anesthesia including but not limited to:  - adverse reactions to medications - damage to eyes, teeth, lips or other oral mucosa - nerve damage due to positioning  - sore throat or hoarseness - Damage to heart, brain, nerves, lungs, other parts of body or loss of life  Patient voiced understanding.)        Anesthesia Quick Evaluation

## 2022-03-03 NOTE — Anesthesia Procedure Notes (Signed)
Anesthesia Regional Block: Interscalene brachial plexus block   Pre-Anesthetic Checklist: , timeout performed,  Correct Patient, Correct Site, Correct Laterality,  Correct Procedure, Correct Position, site marked,  Risks and benefits discussed,  Surgical consent,  Pre-op evaluation,  At surgeon's request and post-op pain management  Laterality: Right and Upper  Prep: chloraprep       Needles:  Injection technique: Single-shot  Needle Type: Stimiplex     Needle Length: 5cm  Needle Gauge: 22     Additional Needles:   Procedures:,,,, ultrasound used (permanent image in chart),,    Narrative:  Start time: 03/03/2022 9:34 AM End time: 03/03/2022 9:37 AM Injection made incrementally with aspirations every 5 mL.  Performed by: Personally  Anesthesiologist: Martha Clan, MD  Additional Notes: Functioning IV was confirmed and monitors were applied.  A 13m 22ga Stimuplex needle was used. Sterile prep and drape,hand hygiene and sterile gloves were used.  Negative aspiration and negative test dose prior to incremental administration of local anesthetic. The patient tolerated the procedure well.

## 2022-03-03 NOTE — H&P (Signed)
History of Present Illness: Carla Gill is a 51 y.o. who presents today for history and physical. She is to undergo a right shoulder decompression, debridement, biceps tenodesis with repair of rotator cuff on 03/03/2022. Last seen on 01/05/2022. There have been no change in her condition since that time. Pain has continued to interfere with the quality of her life and she wishes to proceed with surgery.  The patient's symptoms began 5-7 months ago and developed without any specific cause or injury . However, she does work for the [employer] which requires her to perform a lot of lifting, pushing, and pulling activities, so she feels that these activities may have contributed to the development of her right shoulder symptoms. Her symptoms began to worsen so she saw Rachelle Hora, PA-C, who gave her a steroid injection which provided little if any relief of her symptoms. Therefore, she was sent for an MRI scan and referred to me for further evaluation and treatment. The patient describes the symptoms as marked (major pain with significant limitations) and have the quality of being aching, miserable, nagging, stabbing, tender, and throbbing. The pain is localized to the lateral arm/shoulder. These symptoms are aggravated constantly, with normal daily activities, with sleeping, carrying heavy objects, at higher levels of activity, with overhead activity, and reaching behind the back. She has tried acetaminophen and non-steroidal anti-inflammatories (aspirin and Voltaren gel) with no significant benefit. She has tried physical therapy , rest , and heat with limited benefit. She has tried the 1 injection noted above which provided little if any relief of her symptoms. She denies any neck pain, nor does she note any numbness or paresthesias down her arm to her hand. She is right-hand dominant.   Past Medical History:  Asthma, unspecified asthma severity, unspecified whether complicated, unspecified whether  persistent   Diabetes mellitus without complication (CMS-HCC)   History of chicken pox   Hypertension   Sleep apnea   Past Surgical History:  CESAREAN SECTION   CHOLECYSTECTOMY   FOOT SURGERY   TUBAL LIGATION   Past Family History:  High blood pressure (Hypertension) Mother   Diabetes type II Mother   Heart disease Father   Myocardial Infarction (Heart attack) Father   High blood pressure (Hypertension) Sister   Diabetes type II Sister   High blood pressure (Hypertension) Sister   Diabetes type II Sister   High blood pressure (Hypertension) Sister   Diabetes type II Sister   High blood pressure (Hypertension) Sister   Diabetes type II Sister   Depression Brother   Heart disease Brother   High blood pressure (Hypertension) Brother   Diabetes type II Brother   Mental illness Brother   High blood pressure (Hypertension) Brother   Diabetes type II Brother   High blood pressure (Hypertension) Brother   Diabetes type II Brother   High blood pressure (Hypertension) Brother   Diabetes type II Brother   High blood pressure (Hypertension) Brother   Diabetes type II Brother   Medications:  ibuprofen (MOTRIN) 800 MG tablet Take 1 tablet (800 mg total) by mouth every 8 (eight) hours as needed.   albuterol 90 mcg/actuation inhaler INHALE 1 PUFF INTO THE LUNGS EVERY 6 HOURS AS NEEDED FOR WHEEZING OR SHORTNESS OF BREATH   atorvastatin (LIPITOR) 40 MG tablet Take 40 mg by mouth once daily   diclofenac (VOLTAREN) 1 % topical gel Apply 2 g topically continuously as needed   dilTIAZem (CARDIZEM CD) 180 MG CD capsule Take 180 mg by mouth once  daily   diphenhydrAMINE (BENADRYL) 25 mg tablet Take 25 mg by mouth at bedtime as needed FOR ALLERGIES   metFORMIN (GLUCOPHAGE) 500 MG tablet Take 500 mg by mouth 2 (two) times daily with meals   multivitamin with minerals tablet Take 1 tablet by mouth once daily   olmesartan (BENICAR) 40 MG tablet Take 40 mg by mouth once daily   rosuvastatin  (CRESTOR) 5 MG tablet Take 5 mg by mouth once daily   vit C/Zn gluc/herbal no.325 (ELDERBERRY ZINC VIT C MM) Take 1 tablet by mouth once daily   Allergies:  Penicillins Hives   Review of Systems: A comprehensive 14 point ROS was performed, reviewed, and the pertinent orthopaedic findings are documented in the HPI.  Physical Exam: BP 134/80 (BP Location: Left upper arm, Patient Position: Sitting, BP Cuff Size: Adult)  Ht 157.5 cm ('5\' 2"'$ )  Wt 93.4 kg (206 lb)  BMI 37.68 kg/m   General: Well-developed well-nourished female seen in no acute distress.   HEENT: Atraumatic,normocephalic. Pupils are equal and reactive to light. Oropharynx is clear with moist mucosa  Lungs: Clear to auscultation bilaterally   Cardiovascular: Regular rate and rhythm. Normal S1, S2. No murmurs. No appreciable gallops or rubs. Peripheral pulses are palpable.  Abdomen: Soft, non-tender, nondistended. Bowel sounds present  Right shoulder exam: SKIN: normal SWELLING: none WARMTH: none LYMPH NODES: no adenopathy palpable CREPITUS: none TENDERNESS: Mildly tender along lateral acromion ROM (active):  Forward flexion: 60 degrees Abduction: 45 degrees Internal rotation: L2 ROM (passive):  Forward flexion: 130 degrees Abduction: 120 degrees  ER/IR at 90 abd: 85 degrees / 50 degrees   She has moderate pain at the extremes of all motions.   STRENGTH: Forward flexion: 3/5 Abduction: 3/5 External rotation: 4/5 Internal rotation: 4-4+/5 Pain with RC testing: Moderate to severe pain with attempted forward flexion and abduction.   STABILITY: Normal   SPECIAL TESTS: Luan Pulling' test: positive, moderate Speed's test: positive Capsulitis - pain w/ passive ER: no Crossed arm test: Mildly positive Crank: Not evaluated Anterior apprehension: Negative Posterior apprehension: Not evaluated  Neurological: The patient is alert and oriented Sensation to light touch appears to be intact and within normal  limits Gross motor strength appeared to be equal to 5/5  Vascular : Peripheral pulses felt to be palpable. Capillary refill appears to be intact and within normal limits  Right Shoulder Imaging, MRI: MRI Shoulder Cartilage: No cartilage abnormality. MRI Shoulder Rotator Cuff: Full thickness tear of the supraspinatus and infraspinatus tendons. Retracted to the humeral head. There also is moderate tendinopathy of the subscapularis tendon without evidence of partial or full-thickness tearing. MRI Shoulder Labrum / Biceps: Biceps tendinopathy. MRI Shoulder Bone: Normal bone.  Impression: 1. Massive rotator cuff tear right shoulder. 2. Biceps tendinopathy, right shoulder.  Plan:  The treatment options were discussed with the patient. In addition, patient educational materials were provided regarding the diagnosis and treatment options. The patient is quite frustrated by her symptoms and functional limitations and is ready to consider more aggressive treatment options. Therefore, I have recommended a surgical procedure. Given her age, I feel it be best to try to perform a primary repair of her rotator cuff rather than to jump to a reverse total shoulder arthroplasty. The procedure was discussed with the patient, as were the potential risks (including bleeding, infection, nerve and/or blood vessel injury, persistent or recurrent pain, failure of the repair, progression of arthritis, need for further surgery, blood clots, strokes, heart attacks and/or arhythmias, pneumonia, etc.) and  benefits. The patient states her understanding and wishes to proceed. However, she would like to hold off on scheduling surgery at this time as she is in the process of trying to file for disability. She would like to have an answer to her request on this matter before agreeing to surgery. She understands that further delay might further compromise the ability to successfully repair her rotator cuff tear. All of the patient's  questions and concerns were answered. She can call any time with further concerns. She will follow up once that she is able to resolve her disability claim. This office visit took 45 minutes, of which >50% involved patient counseling/education.   H&P reviewed and patient re-examined. No changes.

## 2022-03-03 NOTE — Transfer of Care (Signed)
Immediate Anesthesia Transfer of Care Note  Patient: Carla Gill  Procedure(s) Performed: SHOULDER ARTHROSCOPY WITH DEBRIDEMENT, DECOMPRESSION, ROTATOR CUFF REPAIR AND BICEP TENDON REPAIR (Right: Shoulder)  Patient Location: PACU  Anesthesia Type:General  Level of Consciousness: awake, alert  and oriented  Airway & Oxygen Therapy: Patient Spontanous Breathing  Post-op Assessment: Report given to RN and Post -op Vital signs reviewed and stable  Post vital signs: Reviewed and stable  Last Vitals:  Vitals Value Taken Time  BP    Temp    Pulse    Resp    SpO2      Last Pain:  Vitals:   03/03/22 0955  TempSrc:   PainSc: Asleep         Complications: No notable events documented.

## 2022-03-04 ENCOUNTER — Encounter: Payer: Self-pay | Admitting: Surgery

## 2022-03-06 NOTE — Anesthesia Postprocedure Evaluation (Signed)
Anesthesia Post Note  Patient: Carla Gill  Procedure(s) Performed: SHOULDER ARTHROSCOPY WITH DEBRIDEMENT, DECOMPRESSION, ROTATOR CUFF REPAIR AND BICEP TENDON REPAIR (Right: Shoulder)  Patient location during evaluation: PACU Anesthesia Type: General Level of consciousness: awake and alert Pain management: pain level controlled Vital Signs Assessment: post-procedure vital signs reviewed and stable Respiratory status: spontaneous breathing, nonlabored ventilation, respiratory function stable and patient connected to nasal cannula oxygen Cardiovascular status: blood pressure returned to baseline and stable Postop Assessment: no apparent nausea or vomiting Anesthetic complications: no   No notable events documented.   Last Vitals:  Vitals:   03/03/22 1357 03/03/22 1441  BP: 119/75 127/73  Pulse: 66 71  Resp: 20 20  Temp: (!) 36.3 C   SpO2: 97% 97%    Last Pain:  Vitals:   03/03/22 1441  TempSrc:   PainSc: 0-No pain                 Martha Clan

## 2022-03-10 DIAGNOSIS — M25511 Pain in right shoulder: Secondary | ICD-10-CM | POA: Diagnosis not present

## 2022-03-10 DIAGNOSIS — Z9889 Other specified postprocedural states: Secondary | ICD-10-CM | POA: Diagnosis not present

## 2022-03-17 DIAGNOSIS — Z9889 Other specified postprocedural states: Secondary | ICD-10-CM | POA: Diagnosis not present

## 2022-03-17 DIAGNOSIS — M25511 Pain in right shoulder: Secondary | ICD-10-CM | POA: Diagnosis not present

## 2022-03-20 ENCOUNTER — Other Ambulatory Visit: Payer: Self-pay | Admitting: Internal Medicine

## 2022-03-26 DIAGNOSIS — Z9889 Other specified postprocedural states: Secondary | ICD-10-CM | POA: Diagnosis not present

## 2022-03-26 DIAGNOSIS — M25511 Pain in right shoulder: Secondary | ICD-10-CM | POA: Diagnosis not present

## 2022-03-31 DIAGNOSIS — Z9889 Other specified postprocedural states: Secondary | ICD-10-CM | POA: Diagnosis not present

## 2022-03-31 DIAGNOSIS — M25511 Pain in right shoulder: Secondary | ICD-10-CM | POA: Diagnosis not present

## 2022-04-15 DIAGNOSIS — M25621 Stiffness of right elbow, not elsewhere classified: Secondary | ICD-10-CM | POA: Diagnosis not present

## 2022-04-15 DIAGNOSIS — Z9889 Other specified postprocedural states: Secondary | ICD-10-CM | POA: Diagnosis not present

## 2022-04-17 NOTE — Progress Notes (Unsigned)
There were no vitals taken for this visit.   Subjective:    Patient ID: Carla Gill, female    DOB: [birth year], 51 y.o.   MRN: [id number]  HPI: Carla Gill is a 51 y.o. female  No chief complaint on file.  DIABETES Hypoglycemic episodes:no Polydipsia/polyuria: no Visual disturbance: no Chest pain: no Paresthesias: no Glucose Monitoring: yes  Accucheck frequency: BID  Fasting glucose: 118-140  Post prandial:  Evening:  Before meals: Taking Insulin?: no  Long acting insulin:  Short acting insulin: Blood Pressure Monitoring: daily Retinal Examination: Up to Date Foot Exam: Up to Date Diabetic Education: Not Completed Pneumovax: Up to Date Influenza: Up to Date Aspirin: no  HYPERTENSION / [place] Satisfied with current treatment? yes Duration of hypertension: years BP monitoring frequency: daily BP range: 151/100 BP medication side effects: no Past BP meds: diltiazem and olmesartan (benicar) Duration of hyperlipidemia: years Cholesterol medication side effects: no Cholesterol supplements: none Past cholesterol medications: rosuvastatin (crestor) Medication compliance: excellent compliance Aspirin: no Recent stressors: no Recurrent headaches: no Visual changes: no Palpitations: no Dyspnea: no Chest pain: no Lower extremity edema: no Dizzy/lightheaded: no   Relevant past medical, surgical, family and social history reviewed and updated as indicated. Interim medical history since our last visit reviewed. Allergies and medications reviewed and updated.  Review of Systems  Eyes:  Negative for visual disturbance.  Respiratory:  Negative for chest tightness and shortness of breath.   Cardiovascular:  Negative for chest pain, palpitations and leg swelling.  Endocrine: Negative for polydipsia and polyuria.  Neurological:  Negative for dizziness, light-headedness, numbness and headaches.    Per HPI unless specifically indicated above      Objective:    There were no vitals taken for this visit.  Wt Readings from Last 3 Encounters:  03/03/22 207 lb 0.2 oz (93.9 kg)  02/23/22 207 lb (93.9 kg)  02/02/22 204 lb 6.4 oz (92.7 kg)    Physical Exam Vitals and nursing note reviewed.  Constitutional:      General: She is not in acute distress.    Appearance: Normal appearance. She is normal weight. She is not ill-appearing, toxic-appearing or diaphoretic.  HENT:     Head: Normocephalic.     Right Ear: External ear normal.     Left Ear: External ear normal.     Nose: Nose normal.     Mouth/Throat:     Mouth: Mucous membranes are moist.     Pharynx: Oropharynx is clear.  Eyes:     General:        Right eye: No discharge.        Left eye: No discharge.     Extraocular Movements: Extraocular movements intact.     Conjunctiva/sclera: Conjunctivae normal.     Pupils: Pupils are equal, round, and reactive to light.  Cardiovascular:     Rate and Rhythm: Normal rate and regular rhythm.     Heart sounds: No murmur heard. Pulmonary:     Effort: Pulmonary effort is normal. No respiratory distress.     Breath sounds: Normal breath sounds. No wheezing or rales.  Musculoskeletal:     Cervical back: Normal range of motion and neck supple.  Skin:    General: Skin is warm and dry.     Capillary Refill: Capillary refill takes less than 2 seconds.  Neurological:     General: No focal deficit present.     Mental Status: She is alert and oriented to person, place, and time. Mental  status is at baseline.  Psychiatric:        Mood and Affect: Mood normal.        Behavior: Behavior normal.        Thought Content: Thought content normal.        Judgment: Judgment normal.     Results for orders placed or performed during the hospital encounter of 03/03/22  Glucose, capillary  Result Value Ref Range   Glucose-Capillary 160 (H) 70 - 99 mg/dL  Glucose, capillary  Result Value Ref Range   Glucose-Capillary 168 (H) 70 - 99 mg/dL   Pregnancy, urine POC  Result Value Ref Range   Preg Test, Ur NEGATIVE NEGATIVE      Assessment & Plan:   Problem List Items Addressed This Visit   None    Follow up plan: No follow-ups on file.

## 2022-04-20 ENCOUNTER — Encounter: Payer: Self-pay | Admitting: Nurse Practitioner

## 2022-04-20 ENCOUNTER — Ambulatory Visit: Payer: Federal, State, Local not specified - PPO | Admitting: Nurse Practitioner

## 2022-04-20 VITALS — BP 132/84 | HR 65 | Temp 97.8°F | Wt 212.7 lb

## 2022-04-20 DIAGNOSIS — Z1231 Encounter for screening mammogram for malignant neoplasm of breast: Secondary | ICD-10-CM

## 2022-04-20 DIAGNOSIS — E1169 Type 2 diabetes mellitus with other specified complication: Secondary | ICD-10-CM

## 2022-04-20 DIAGNOSIS — E785 Hyperlipidemia, unspecified: Secondary | ICD-10-CM

## 2022-04-20 DIAGNOSIS — I10 Essential (primary) hypertension: Secondary | ICD-10-CM

## 2022-04-20 DIAGNOSIS — Z23 Encounter for immunization: Secondary | ICD-10-CM | POA: Diagnosis not present

## 2022-04-20 DIAGNOSIS — Z6841 Body Mass Index (BMI) 40.0 and over, adult: Secondary | ICD-10-CM

## 2022-04-20 DIAGNOSIS — E118 Type 2 diabetes mellitus with unspecified complications: Secondary | ICD-10-CM

## 2022-04-20 MED ORDER — DAPAGLIFLOZIN PROPANEDIOL 10 MG PO TABS: 10.0000 mg | ORAL_TABLET | Freq: Every day | ORAL | 1 refills | Status: DC

## 2022-04-20 MED ORDER — METFORMIN HCL 500 MG PO TABS: 500.0000 mg | ORAL_TABLET | Freq: Two times a day (BID) | ORAL | 1 refills | Status: DC

## 2022-04-20 MED ORDER — ROSUVASTATIN CALCIUM 5 MG PO TABS
ORAL_TABLET | ORAL | 1 refills | Status: DC
Start: 2022-04-20 — End: 2023-01-11

## 2022-04-20 NOTE — Assessment & Plan Note (Signed)
Chronic. Well controlled on Metformin '500mg'$  BID.  Labs ordered today.  Last A1c was 7.8.  Will start Farxiga to help with kidney protection and A1c control.  .  Sugars have been running 140s. Has eye exam scheduled for later this month.  Will make further recommendations based on lab results.  Follow up in 6 months.  Call sooner if concerns arise.

## 2022-04-20 NOTE — Assessment & Plan Note (Signed)
Chronic.  Controlled.  Continue with current medication regimen of Crestor.  Labs ordered today.  Return to clinic in 6 months for reevaluation.  Call sooner if concerns arise.   

## 2022-04-20 NOTE — Assessment & Plan Note (Signed)
Recommend a healthy lifestyle through diet and exercise.  °

## 2022-04-20 NOTE — Assessment & Plan Note (Signed)
Chronic.  Controlled.  Continue with current medication regimen of Olmesartan '40mg'$  and Cardizem daily. Blood pressures run 130/80s at home.  Labs ordered today.  Return to clinic in 6 months for reevaluation.  Call sooner if concerns arise.

## 2022-04-21 ENCOUNTER — Ambulatory Visit: Payer: Self-pay | Admitting: *Deleted

## 2022-04-21 DIAGNOSIS — Z9889 Other specified postprocedural states: Secondary | ICD-10-CM | POA: Diagnosis not present

## 2022-04-21 DIAGNOSIS — M25621 Stiffness of right elbow, not elsewhere classified: Secondary | ICD-10-CM | POA: Diagnosis not present

## 2022-04-21 LAB — COMPREHENSIVE METABOLIC PANEL
ALT: 48 IU/L — ABNORMAL HIGH (ref 0–32)
AST: 27 IU/L (ref 0–40)
Albumin/Globulin Ratio: 1.8 (ref 1.2–2.2)
Albumin: 4.4 g/dL (ref 3.8–4.9)
Alkaline Phosphatase: 107 IU/L (ref 44–121)
BUN/Creatinine Ratio: 13 (ref 9–23)
BUN: 9 mg/dL (ref 6–24)
Bilirubin Total: 0.3 mg/dL (ref 0.0–1.2)
CO2: 25 mmol/L (ref 20–29)
Calcium: 9.5 mg/dL (ref 8.7–10.2)
Chloride: 102 mmol/L (ref 96–106)
Creatinine, Ser: 0.72 mg/dL (ref 0.57–1.00)
Globulin, Total: 2.5 g/dL (ref 1.5–4.5)
Glucose: 133 mg/dL — ABNORMAL HIGH (ref 70–99)
Potassium: 4 mmol/L (ref 3.5–5.2)
Sodium: 140 mmol/L (ref 134–144)
Total Protein: 6.9 g/dL (ref 6.0–8.5)
eGFR: 101 mL/min/{1.73_m2} (ref >59)

## 2022-04-21 LAB — LIPID PANEL
Chol/HDL Ratio: 2.6 ratio (ref 0.0–4.4)
Cholesterol, Total: 166 mg/dL (ref 100–199)
HDL: 64 mg/dL (ref >39)
LDL Chol Calc (NIH): 92 mg/dL (ref 0–99)
Triglycerides: 51 mg/dL (ref 0–149)
VLDL Cholesterol Cal: 10 mg/dL (ref 5–40)

## 2022-04-21 LAB — HEMOGLOBIN A1C
Est. average glucose Bld gHb Est-mCnc: 183 mg/dL
Hgb A1c MFr Bld: 8 % — ABNORMAL HIGH (ref 4.8–5.6)

## 2022-04-21 NOTE — Telephone Encounter (Signed)
Pt questioning if supposed to take [place] and Metformin. She was unsure if one replaced the other. OV note read and she is to take [place] in addition to Metformin due to her A1C elevating. Verbalized understanding, no further questions.  Reason for Disposition  Health Information question, no triage required and triager able to answer question  Answer Assessment - Initial Assessment Questions 1. REASON FOR CALL or QUESTION: "What is your reason for calling today?" or "How can I best help you?" or "What question do you have that I can help answer?"     Doctor put her on med Farxiga yesterday, not sure if to continue Metformin or if this replaces it.  Protocols used: Information Only Call - No Triage-A-AH

## 2022-04-21 NOTE — Progress Notes (Signed)
Hi Carla Gill. It was nice to see you yesterday.  Your A1c did increase from 7.4 to 8.0.  Make sure to start the Farxiga as discussed during the visit.  This should help bring down your A1c.  No other concerns at this time. Continue with your current medication regimen.  Follow up as discussed.  Please let me know if you have any questions.

## 2022-04-22 ENCOUNTER — Telehealth: Payer: Self-pay

## 2022-04-22 NOTE — Telephone Encounter (Signed)
Spoke with patient of mammogram appt scheduled for 06/11/2022 at 1 PM

## 2022-04-22 NOTE — Telephone Encounter (Signed)
-----   Message from Jon Billings, NP sent at 04/20/2022 11:03 AM EDT ----- Can we make her mammogram appt for december

## 2022-04-23 DIAGNOSIS — Z9889 Other specified postprocedural states: Secondary | ICD-10-CM | POA: Diagnosis not present

## 2022-04-28 DIAGNOSIS — M25621 Stiffness of right elbow, not elsewhere classified: Secondary | ICD-10-CM | POA: Diagnosis not present

## 2022-04-28 DIAGNOSIS — Z9889 Other specified postprocedural states: Secondary | ICD-10-CM | POA: Diagnosis not present

## 2022-04-30 DIAGNOSIS — M25621 Stiffness of right elbow, not elsewhere classified: Secondary | ICD-10-CM | POA: Diagnosis not present

## 2022-04-30 DIAGNOSIS — Z9889 Other specified postprocedural states: Secondary | ICD-10-CM | POA: Diagnosis not present

## 2022-05-04 ENCOUNTER — Ambulatory Visit: Payer: Federal, State, Local not specified - PPO | Admitting: Nurse Practitioner

## 2022-05-04 DIAGNOSIS — M25511 Pain in right shoulder: Secondary | ICD-10-CM | POA: Diagnosis not present

## 2022-05-04 DIAGNOSIS — R29898 Other symptoms and signs involving the musculoskeletal system: Secondary | ICD-10-CM | POA: Diagnosis not present

## 2022-05-04 DIAGNOSIS — M25621 Stiffness of right elbow, not elsewhere classified: Secondary | ICD-10-CM | POA: Diagnosis not present

## 2022-05-04 DIAGNOSIS — Z9889 Other specified postprocedural states: Secondary | ICD-10-CM | POA: Diagnosis not present

## 2022-05-07 DIAGNOSIS — M25621 Stiffness of right elbow, not elsewhere classified: Secondary | ICD-10-CM | POA: Diagnosis not present

## 2022-05-07 DIAGNOSIS — M25511 Pain in right shoulder: Secondary | ICD-10-CM | POA: Diagnosis not present

## 2022-05-07 DIAGNOSIS — Z9889 Other specified postprocedural states: Secondary | ICD-10-CM | POA: Diagnosis not present

## 2022-05-07 DIAGNOSIS — R29898 Other symptoms and signs involving the musculoskeletal system: Secondary | ICD-10-CM | POA: Diagnosis not present

## 2022-05-11 DIAGNOSIS — M25511 Pain in right shoulder: Secondary | ICD-10-CM | POA: Diagnosis not present

## 2022-05-11 DIAGNOSIS — Z9889 Other specified postprocedural states: Secondary | ICD-10-CM | POA: Diagnosis not present

## 2022-05-11 DIAGNOSIS — M25621 Stiffness of right elbow, not elsewhere classified: Secondary | ICD-10-CM | POA: Diagnosis not present

## 2022-05-11 DIAGNOSIS — R29898 Other symptoms and signs involving the musculoskeletal system: Secondary | ICD-10-CM | POA: Diagnosis not present

## 2022-05-12 LAB — HM DIABETES EYE EXAM

## 2022-05-14 DIAGNOSIS — Z9889 Other specified postprocedural states: Secondary | ICD-10-CM | POA: Diagnosis not present

## 2022-05-14 DIAGNOSIS — M25621 Stiffness of right elbow, not elsewhere classified: Secondary | ICD-10-CM | POA: Diagnosis not present

## 2022-05-18 DIAGNOSIS — Z9889 Other specified postprocedural states: Secondary | ICD-10-CM | POA: Diagnosis not present

## 2022-05-18 DIAGNOSIS — R29898 Other symptoms and signs involving the musculoskeletal system: Secondary | ICD-10-CM | POA: Diagnosis not present

## 2022-06-02 DIAGNOSIS — M25511 Pain in right shoulder: Secondary | ICD-10-CM | POA: Diagnosis not present

## 2022-06-02 DIAGNOSIS — R29898 Other symptoms and signs involving the musculoskeletal system: Secondary | ICD-10-CM | POA: Diagnosis not present

## 2022-06-02 DIAGNOSIS — Z9889 Other specified postprocedural states: Secondary | ICD-10-CM | POA: Diagnosis not present

## 2022-06-02 DIAGNOSIS — M25621 Stiffness of right elbow, not elsewhere classified: Secondary | ICD-10-CM | POA: Diagnosis not present

## 2022-06-04 DIAGNOSIS — R29898 Other symptoms and signs involving the musculoskeletal system: Secondary | ICD-10-CM | POA: Diagnosis not present

## 2022-06-04 DIAGNOSIS — M25621 Stiffness of right elbow, not elsewhere classified: Secondary | ICD-10-CM | POA: Diagnosis not present

## 2022-06-04 DIAGNOSIS — Z9889 Other specified postprocedural states: Secondary | ICD-10-CM | POA: Diagnosis not present

## 2022-06-04 DIAGNOSIS — M25511 Pain in right shoulder: Secondary | ICD-10-CM | POA: Diagnosis not present

## 2022-06-09 DIAGNOSIS — Z1231 Encounter for screening mammogram for malignant neoplasm of breast: Secondary | ICD-10-CM | POA: Diagnosis not present

## 2022-06-09 LAB — HM MAMMOGRAPHY

## 2022-06-10 ENCOUNTER — Encounter: Payer: Self-pay | Admitting: Nurse Practitioner

## 2022-06-10 DIAGNOSIS — Z01419 Encounter for gynecological examination (general) (routine) without abnormal findings: Secondary | ICD-10-CM | POA: Diagnosis not present

## 2022-06-11 DIAGNOSIS — Z9889 Other specified postprocedural states: Secondary | ICD-10-CM | POA: Diagnosis not present

## 2022-06-15 ENCOUNTER — Other Ambulatory Visit: Payer: Self-pay | Admitting: Internal Medicine

## 2022-06-16 DIAGNOSIS — R29898 Other symptoms and signs involving the musculoskeletal system: Secondary | ICD-10-CM | POA: Diagnosis not present

## 2022-06-16 DIAGNOSIS — M25511 Pain in right shoulder: Secondary | ICD-10-CM | POA: Diagnosis not present

## 2022-06-16 DIAGNOSIS — M25621 Stiffness of right elbow, not elsewhere classified: Secondary | ICD-10-CM | POA: Diagnosis not present

## 2022-06-16 DIAGNOSIS — Z9889 Other specified postprocedural states: Secondary | ICD-10-CM | POA: Diagnosis not present

## 2022-06-18 DIAGNOSIS — M25621 Stiffness of right elbow, not elsewhere classified: Secondary | ICD-10-CM | POA: Diagnosis not present

## 2022-06-18 DIAGNOSIS — R29898 Other symptoms and signs involving the musculoskeletal system: Secondary | ICD-10-CM | POA: Diagnosis not present

## 2022-06-18 DIAGNOSIS — M25511 Pain in right shoulder: Secondary | ICD-10-CM | POA: Diagnosis not present

## 2022-06-18 DIAGNOSIS — Z9889 Other specified postprocedural states: Secondary | ICD-10-CM | POA: Diagnosis not present

## 2022-06-24 DIAGNOSIS — M25511 Pain in right shoulder: Secondary | ICD-10-CM | POA: Diagnosis not present

## 2022-06-24 DIAGNOSIS — Z9889 Other specified postprocedural states: Secondary | ICD-10-CM | POA: Diagnosis not present

## 2022-06-24 DIAGNOSIS — R29898 Other symptoms and signs involving the musculoskeletal system: Secondary | ICD-10-CM | POA: Diagnosis not present

## 2022-06-24 DIAGNOSIS — M25621 Stiffness of right elbow, not elsewhere classified: Secondary | ICD-10-CM | POA: Diagnosis not present

## 2022-07-02 DIAGNOSIS — M25511 Pain in right shoulder: Secondary | ICD-10-CM | POA: Diagnosis not present

## 2022-07-02 DIAGNOSIS — Z9889 Other specified postprocedural states: Secondary | ICD-10-CM | POA: Diagnosis not present

## 2022-07-02 DIAGNOSIS — M25621 Stiffness of right elbow, not elsewhere classified: Secondary | ICD-10-CM | POA: Diagnosis not present

## 2022-07-02 DIAGNOSIS — R29898 Other symptoms and signs involving the musculoskeletal system: Secondary | ICD-10-CM | POA: Diagnosis not present

## 2022-07-07 DIAGNOSIS — Z9889 Other specified postprocedural states: Secondary | ICD-10-CM | POA: Diagnosis not present

## 2022-07-10 DIAGNOSIS — M7521 Bicipital tendinitis, right shoulder: Secondary | ICD-10-CM | POA: Diagnosis not present

## 2022-07-10 DIAGNOSIS — M7551 Bursitis of right shoulder: Secondary | ICD-10-CM | POA: Diagnosis not present

## 2022-07-10 DIAGNOSIS — M7581 Other shoulder lesions, right shoulder: Secondary | ICD-10-CM | POA: Diagnosis not present

## 2022-07-10 DIAGNOSIS — M24111 Other articular cartilage disorders, right shoulder: Secondary | ICD-10-CM | POA: Diagnosis not present

## 2022-07-10 DIAGNOSIS — M67911 Unspecified disorder of synovium and tendon, right shoulder: Secondary | ICD-10-CM | POA: Diagnosis not present

## 2022-07-10 DIAGNOSIS — M75121 Complete rotator cuff tear or rupture of right shoulder, not specified as traumatic: Secondary | ICD-10-CM | POA: Diagnosis not present

## 2022-07-20 DIAGNOSIS — M25621 Stiffness of right elbow, not elsewhere classified: Secondary | ICD-10-CM | POA: Diagnosis not present

## 2022-07-20 DIAGNOSIS — Z9889 Other specified postprocedural states: Secondary | ICD-10-CM | POA: Diagnosis not present

## 2022-07-20 DIAGNOSIS — M25511 Pain in right shoulder: Secondary | ICD-10-CM | POA: Diagnosis not present

## 2022-07-20 DIAGNOSIS — R29898 Other symptoms and signs involving the musculoskeletal system: Secondary | ICD-10-CM | POA: Diagnosis not present

## 2022-07-28 DIAGNOSIS — M25621 Stiffness of right elbow, not elsewhere classified: Secondary | ICD-10-CM | POA: Diagnosis not present

## 2022-07-28 DIAGNOSIS — R29898 Other symptoms and signs involving the musculoskeletal system: Secondary | ICD-10-CM | POA: Diagnosis not present

## 2022-07-28 DIAGNOSIS — Z9889 Other specified postprocedural states: Secondary | ICD-10-CM | POA: Diagnosis not present

## 2022-07-28 DIAGNOSIS — M25511 Pain in right shoulder: Secondary | ICD-10-CM | POA: Diagnosis not present

## 2022-08-05 DIAGNOSIS — Z9889 Other specified postprocedural states: Secondary | ICD-10-CM | POA: Diagnosis not present

## 2022-08-05 DIAGNOSIS — M25621 Stiffness of right elbow, not elsewhere classified: Secondary | ICD-10-CM | POA: Diagnosis not present

## 2022-08-05 DIAGNOSIS — R29898 Other symptoms and signs involving the musculoskeletal system: Secondary | ICD-10-CM | POA: Diagnosis not present

## 2022-08-05 DIAGNOSIS — M25511 Pain in right shoulder: Secondary | ICD-10-CM | POA: Diagnosis not present

## 2022-08-14 DIAGNOSIS — M25511 Pain in right shoulder: Secondary | ICD-10-CM | POA: Diagnosis not present

## 2022-08-14 DIAGNOSIS — M25621 Stiffness of right elbow, not elsewhere classified: Secondary | ICD-10-CM | POA: Diagnosis not present

## 2022-08-14 DIAGNOSIS — R29898 Other symptoms and signs involving the musculoskeletal system: Secondary | ICD-10-CM | POA: Diagnosis not present

## 2022-08-14 DIAGNOSIS — Z9889 Other specified postprocedural states: Secondary | ICD-10-CM | POA: Diagnosis not present

## 2022-08-18 DIAGNOSIS — Z9889 Other specified postprocedural states: Secondary | ICD-10-CM | POA: Diagnosis not present

## 2022-08-21 DIAGNOSIS — Z9889 Other specified postprocedural states: Secondary | ICD-10-CM | POA: Diagnosis not present

## 2022-08-21 DIAGNOSIS — M25511 Pain in right shoulder: Secondary | ICD-10-CM | POA: Diagnosis not present

## 2022-08-21 DIAGNOSIS — M25621 Stiffness of right elbow, not elsewhere classified: Secondary | ICD-10-CM | POA: Diagnosis not present

## 2022-08-21 DIAGNOSIS — R29898 Other symptoms and signs involving the musculoskeletal system: Secondary | ICD-10-CM | POA: Diagnosis not present

## 2022-08-24 DIAGNOSIS — M7581 Other shoulder lesions, right shoulder: Secondary | ICD-10-CM | POA: Diagnosis not present

## 2022-08-24 DIAGNOSIS — M24111 Other articular cartilage disorders, right shoulder: Secondary | ICD-10-CM | POA: Diagnosis not present

## 2022-08-24 DIAGNOSIS — M7521 Bicipital tendinitis, right shoulder: Secondary | ICD-10-CM | POA: Diagnosis not present

## 2022-08-24 DIAGNOSIS — M75121 Complete rotator cuff tear or rupture of right shoulder, not specified as traumatic: Secondary | ICD-10-CM | POA: Diagnosis not present

## 2022-08-26 DIAGNOSIS — M25511 Pain in right shoulder: Secondary | ICD-10-CM | POA: Diagnosis not present

## 2022-08-26 DIAGNOSIS — M25621 Stiffness of right elbow, not elsewhere classified: Secondary | ICD-10-CM | POA: Diagnosis not present

## 2022-08-26 DIAGNOSIS — Z9889 Other specified postprocedural states: Secondary | ICD-10-CM | POA: Diagnosis not present

## 2022-08-26 DIAGNOSIS — R29898 Other symptoms and signs involving the musculoskeletal system: Secondary | ICD-10-CM | POA: Diagnosis not present

## 2022-09-07 DIAGNOSIS — M25621 Stiffness of right elbow, not elsewhere classified: Secondary | ICD-10-CM | POA: Diagnosis not present

## 2022-09-07 DIAGNOSIS — M25511 Pain in right shoulder: Secondary | ICD-10-CM | POA: Diagnosis not present

## 2022-09-07 DIAGNOSIS — R29898 Other symptoms and signs involving the musculoskeletal system: Secondary | ICD-10-CM | POA: Diagnosis not present

## 2022-09-07 DIAGNOSIS — Z9889 Other specified postprocedural states: Secondary | ICD-10-CM | POA: Diagnosis not present

## 2022-09-14 DIAGNOSIS — M25511 Pain in right shoulder: Secondary | ICD-10-CM | POA: Diagnosis not present

## 2022-09-14 DIAGNOSIS — M25621 Stiffness of right elbow, not elsewhere classified: Secondary | ICD-10-CM | POA: Diagnosis not present

## 2022-09-14 DIAGNOSIS — Z9889 Other specified postprocedural states: Secondary | ICD-10-CM | POA: Diagnosis not present

## 2022-09-14 DIAGNOSIS — R29898 Other symptoms and signs involving the musculoskeletal system: Secondary | ICD-10-CM | POA: Diagnosis not present

## 2022-09-17 DIAGNOSIS — R29898 Other symptoms and signs involving the musculoskeletal system: Secondary | ICD-10-CM | POA: Diagnosis not present

## 2022-09-17 DIAGNOSIS — M25511 Pain in right shoulder: Secondary | ICD-10-CM | POA: Diagnosis not present

## 2022-09-17 DIAGNOSIS — Z9889 Other specified postprocedural states: Secondary | ICD-10-CM | POA: Diagnosis not present

## 2022-09-17 DIAGNOSIS — M25621 Stiffness of right elbow, not elsewhere classified: Secondary | ICD-10-CM | POA: Diagnosis not present

## 2022-09-21 DIAGNOSIS — M25511 Pain in right shoulder: Secondary | ICD-10-CM | POA: Diagnosis not present

## 2022-09-21 DIAGNOSIS — Z9889 Other specified postprocedural states: Secondary | ICD-10-CM | POA: Diagnosis not present

## 2022-09-21 DIAGNOSIS — R29898 Other symptoms and signs involving the musculoskeletal system: Secondary | ICD-10-CM | POA: Diagnosis not present

## 2022-09-21 DIAGNOSIS — M25621 Stiffness of right elbow, not elsewhere classified: Secondary | ICD-10-CM | POA: Diagnosis not present

## 2022-09-24 DIAGNOSIS — Z9889 Other specified postprocedural states: Secondary | ICD-10-CM | POA: Diagnosis not present

## 2022-09-24 DIAGNOSIS — M25511 Pain in right shoulder: Secondary | ICD-10-CM | POA: Diagnosis not present

## 2022-09-24 DIAGNOSIS — R29898 Other symptoms and signs involving the musculoskeletal system: Secondary | ICD-10-CM | POA: Diagnosis not present

## 2022-09-24 DIAGNOSIS — M25621 Stiffness of right elbow, not elsewhere classified: Secondary | ICD-10-CM | POA: Diagnosis not present

## 2022-09-28 DIAGNOSIS — M7521 Bicipital tendinitis, right shoulder: Secondary | ICD-10-CM | POA: Diagnosis not present

## 2022-09-28 DIAGNOSIS — M75121 Complete rotator cuff tear or rupture of right shoulder, not specified as traumatic: Secondary | ICD-10-CM | POA: Diagnosis not present

## 2022-09-28 DIAGNOSIS — M24111 Other articular cartilage disorders, right shoulder: Secondary | ICD-10-CM | POA: Diagnosis not present

## 2022-09-28 DIAGNOSIS — M7581 Other shoulder lesions, right shoulder: Secondary | ICD-10-CM | POA: Diagnosis not present

## 2022-10-10 ENCOUNTER — Other Ambulatory Visit: Payer: Self-pay | Admitting: Nurse Practitioner

## 2022-10-12 DIAGNOSIS — Z9889 Other specified postprocedural states: Secondary | ICD-10-CM | POA: Diagnosis not present

## 2022-10-12 DIAGNOSIS — R29898 Other symptoms and signs involving the musculoskeletal system: Secondary | ICD-10-CM | POA: Diagnosis not present

## 2022-10-12 DIAGNOSIS — M25621 Stiffness of right elbow, not elsewhere classified: Secondary | ICD-10-CM | POA: Diagnosis not present

## 2022-10-12 DIAGNOSIS — M25511 Pain in right shoulder: Secondary | ICD-10-CM | POA: Diagnosis not present

## 2022-10-12 NOTE — Telephone Encounter (Signed)
Requested Prescriptions  Pending Prescriptions Disp Refills   FARXIGA 10 MG TABS tablet [Pharmacy Med Name: FARXIGA 10MG  TABLETS] 90 tablet 0    Sig: TAKE 1 TABLET(10 MG) BY MOUTH DAILY BEFORE BREAKFAST     Endocrinology:  Diabetes - SGLT2 Inhibitors Failed - 10/10/2022  4:08 PM      Failed - HBA1C is between 0 and 7.9 and within 180 days    Hgb A1c MFr Bld  Date Value Ref Range Status  04/20/2022 8.0 (H) 4.8 - 5.6 % Final    Comment:             Prediabetes: 5.7 - 6.4          Diabetes: >6.4          Glycemic control for adults with diabetes: <7.0          Passed - Cr in normal range and within 360 days    Creat  Date Value Ref Range Status  03/18/2021 0.69 0.50 - 1.03 mg/dL Final   Creatinine, Ser  Date Value Ref Range Status  04/20/2022 0.72 0.57 - 1.00 mg/dL Final         Passed - eGFR in normal range and within 360 days    eGFR  Date Value Ref Range Status  04/20/2022 101 >59 mL/min/1.73 Final         Passed - Valid encounter within last 6 months    Recent Outpatient Visits           5 months ago Controlled type 2 diabetes mellitus with complication, without long-term current use of insulin (HCC)   [place] [facility] Larae Grooms, NP   8 months ago Controlled type 2 diabetes mellitus with complication, without long-term current use of insulin (HCC)   [place] [facility] Larae Grooms, NP   11 months ago Primary hypertension   [place] [facility] Vigg, Avanti, MD   11 months ago COVID-19   [place] [facility] Vigg, Avanti, MD   1 year ago Controlled type 2 diabetes mellitus with complication, without long-term current use of insulin [facility])   [place] [facility] Larae Grooms, NP

## 2022-10-13 ENCOUNTER — Encounter: Payer: Self-pay | Admitting: Podiatry

## 2022-10-27 ENCOUNTER — Ambulatory Visit: Payer: Federal, State, Local not specified - PPO | Admitting: Podiatry

## 2022-10-28 ENCOUNTER — Ambulatory Visit: Payer: Federal, State, Local not specified - PPO | Admitting: Podiatry

## 2022-10-28 DIAGNOSIS — M722 Plantar fascial fibromatosis: Secondary | ICD-10-CM

## 2022-10-28 DIAGNOSIS — M19079 Primary osteoarthritis, unspecified ankle and foot: Secondary | ICD-10-CM | POA: Diagnosis not present

## 2022-10-28 DIAGNOSIS — E0840 Diabetes mellitus due to underlying condition with diabetic neuropathy, unspecified: Secondary | ICD-10-CM | POA: Diagnosis not present

## 2022-10-28 MED ORDER — MELOXICAM 15 MG PO TABS: 15.0000 mg | ORAL_TABLET | Freq: Every day | ORAL | 1 refills | Status: DC

## 2022-10-28 NOTE — Progress Notes (Signed)
   SUBJECTIVE Patient with a history of diabetes mellitus presenting to the office today for follow-up evaluation of chronic bilateral foot pain has been ongoing for several years.  She does have history of plantar fibromas with surgery back in 2014.  She continues to have pain and tenderness to the bilateral feet. Past Medical History:  Diagnosis Date   Arthritis    Asthma    well controlled   Diabetes    type 2   Environmental and seasonal allergies    Hypertension    Mixed hyperlipidemia    Obesity    Ventral hernia without obstruction or gangrene     OBJECTIVE General Patient is awake, alert, and oriented x 3 and in no acute distress. Derm Skin is dry and supple bilateral. Negative open lesions or macerations. Remaining integument unremarkable. Nails are tender, long, thickened and dystrophic with subungual debris, consistent with onychomycosis, 1-5 bilateral. No signs of infection noted. Vasc  DP and PT pedal pulses palpable bilaterally. Temperature gradient within normal limits.  Neuro Epicritic and protective threshold sensation diminished bilaterally.  Musculoskeletal Exam No symptomatic pedal deformities noted bilateral. Muscular strength within normal limits. Symptomatic palpable plantar fibromas noted along the medial longitudinal arch of the bilateral feet.  Generalized chronic foot pain also noted bilateral  ASSESSMENT 1. Diabetes Mellitus w/ peripheral neuropathy 2.  Symptomatic plantar fibromas bilateral/plantar fasciitis bilateral 3.  H/o plantar fibroma excision LT foot 2014  -Patient evaluated. -I do believe the patient would benefit from diabetic shoes and custom molded Plastizote insoles to help equally distribute pressure throughout the foot and cushion the symptomatic plantar fibromas -Prescription for meloxicam 15 mg daily as needed -Return to clinic for diabetic shoe and insole fitting.  As needed with me  *Currently on disability.    Felecia Shelling,  DPM [facility]  Dr. Felecia Shelling, DPM    [address] [place], [place] [address]                Office [phone]  Fax [phone]

## 2022-10-29 ENCOUNTER — Encounter: Payer: Self-pay | Admitting: Nurse Practitioner

## 2022-10-29 ENCOUNTER — Ambulatory Visit: Payer: Federal, State, Local not specified - PPO | Admitting: Nurse Practitioner

## 2022-10-29 VITALS — BP 117/70 | HR 73 | Temp 98.6°F | Wt 209.5 lb

## 2022-10-29 DIAGNOSIS — I10 Essential (primary) hypertension: Secondary | ICD-10-CM | POA: Diagnosis not present

## 2022-10-29 DIAGNOSIS — Z0000 Encounter for general adult medical examination without abnormal findings: Secondary | ICD-10-CM | POA: Diagnosis not present

## 2022-10-29 DIAGNOSIS — E118 Type 2 diabetes mellitus with unspecified complications: Secondary | ICD-10-CM | POA: Diagnosis not present

## 2022-10-29 DIAGNOSIS — E785 Hyperlipidemia, unspecified: Secondary | ICD-10-CM | POA: Diagnosis not present

## 2022-10-29 DIAGNOSIS — E1169 Type 2 diabetes mellitus with other specified complication: Secondary | ICD-10-CM | POA: Diagnosis not present

## 2022-10-29 LAB — URINALYSIS, ROUTINE W REFLEX MICROSCOPIC
Bilirubin, UA: NEGATIVE
Ketones, UA: NEGATIVE
Leukocytes,UA: NEGATIVE
Nitrite, UA: NEGATIVE
Protein,UA: NEGATIVE
RBC, UA: NEGATIVE
Specific Gravity, UA: 1.02 (ref 1.005–1.030)
Urobilinogen, Ur: 0.2 mg/dL (ref 0.2–1.0)
pH, UA: 5 (ref 5.0–7.5)

## 2022-10-29 LAB — MICROALBUMIN, URINE WAIVED
Creatinine, Urine Waived: 100 mg/dL (ref 10–300)
Microalb, Ur Waived: 30 mg/L — ABNORMAL HIGH (ref 0–19)
Microalb/Creat Ratio: <30 mg/g (ref <30)

## 2022-10-29 MED ORDER — DAPAGLIFLOZIN PROPANEDIOL 10 MG PO TABS: ORAL_TABLET | ORAL | 1 refills | Status: DC

## 2022-10-29 MED ORDER — METFORMIN HCL 500 MG PO TABS: 500.0000 mg | ORAL_TABLET | Freq: Two times a day (BID) | ORAL | 1 refills | Status: DC

## 2022-10-29 MED ORDER — OLMESARTAN MEDOXOMIL 40 MG PO TABS: ORAL_TABLET | ORAL | 1 refills | Status: DC

## 2022-10-29 NOTE — Assessment & Plan Note (Signed)
Recommended eating smaller high protein, low fat meals more frequently and exercising 30 mins a day 5 times a week with a goal of 10-15lb weight loss in the next 3 months.  

## 2022-10-29 NOTE — Progress Notes (Signed)
BP 117/70   Pulse 73   Temp 98.6 F (37 C) (Oral)   Wt 209 lb 8 oz (95 kg)   LMP  (LMP Unknown)   SpO2 98%   BMI 38.32 kg/m    Subjective:    Patient ID: Carla Gill, female    DOB: March 06, 1971, 52 y.o.   MRN: [id number]  HPI: Carla Gill is a 52 y.o. female presenting on 10/29/2022 for comprehensive medical examination. Current medical complaints include: None  She currently lives with: Menopausal Symptoms: no  HYPERTENSION Hypertension status: controlled  Satisfied with current treatment? yes Duration of hypertension: years BP monitoring frequency:  daily BP range: 120-130/80 BP medication side effects:  no Medication compliance: excellent compliance Previous BP meds:amlodipine and olmesartan (benicar) Aspirin: no Recurrent headaches: no Visual changes: no Palpitations: no Dyspnea: no Chest pain: no Lower extremity edema: no Dizzy/lightheaded: no  DIABETES Patient well Marcelline Deist and Metformin Hypoglycemic episodes:no Polydipsia/polyuria: drinks a lot of water Visual disturbance: no Chest pain: no Paresthesias: no Glucose Monitoring: yes  Accucheck frequency: Daily  Fasting glucose: 130-147  Post prandial:  Evening:  Before meals: Taking Insulin?: no  Long acting insulin:  Short acting insulin: Blood Pressure Monitoring: daily Retinal Examination: Up to Date Foot Exam:  Up to date Diabetic Education: Not Completed Pneumovax: Up to Date Influenza: Up to Date Aspirin: no  Depression Screen done today and results listed below:     10/29/2022   10:02 AM 04/20/2022   10:53 AM 02/02/2022    4:15 PM 10/21/2021    3:20 PM 10/03/2021   10:00 AM  Depression screen PHQ 2/9  Decreased Interest 0 0 0 0 0  Down, Depressed, Hopeless 0 0 0 0 0  PHQ - 2 Score 0 0 0 0 0  Altered sleeping 2 0 0 0 0  Tired, decreased energy 2 0 0 0 0  Change in appetite 0 0 0 0 0  Feeling bad or failure about yourself  0 0 0 0 0  Trouble concentrating 0 0 0 0 0  Moving  slowly or fidgety/restless 0 0 0 0 0  Suicidal thoughts 0 0 0 0 0  PHQ-9 Score 4 0 0 0 0  Difficult doing work/chores Not difficult at all Not difficult at all Not difficult at all Not difficult at all Not difficult at all    The patient does not have a history of falls. I did complete a risk assessment for falls. A plan of care for falls was documented.   Past Medical History:  Past Medical History:  Diagnosis Date   Arthritis    Asthma    well controlled   Diabetes    type 2   Environmental and seasonal allergies    Hypertension    Mixed hyperlipidemia    Obesity    Ventral hernia without obstruction or gangrene     Surgical History:  Past Surgical History:  Procedure Laterality Date   CESAREAN SECTION  1990   CHOLECYSTECTOMY  1993   COLONOSCOPY WITH PROPOFOL N/A 07/14/2021   Procedure: COLONOSCOPY WITH PROPOFOL;  Surgeon: Wyline Mood, MD;  Location: [facility] ENDOSCOPY;  Service: Gastroenterology;  Laterality: N/A;   DILATION AND CURETTAGE OF UTERUS     FOOT SURGERY Left    plantar fasciitis   SHOULDER ARTHROSCOPY WITH SUBACROMIAL DECOMPRESSION, ROTATOR CUFF REPAIR AND BICEP TENDON REPAIR Right 03/03/2022   Procedure: SHOULDER ARTHROSCOPY WITH DEBRIDEMENT, DECOMPRESSION, ROTATOR CUFF REPAIR AND BICEP TENDON REPAIR;  Surgeon: Christena Flake, MD;  Location: [facility] ORS;  Service: Orthopedics;  Laterality: Right;   TUBAL LIGATION     XI ROBOTIC ASSISTED VENTRAL HERNIA N/A 07/17/2021   Procedure: XI ROBOTIC ASSISTED VENTRAL HERNIA;  Surgeon: Henrene Dodge, MD;  Location: [facility] ORS;  Service: General;  Laterality: N/A;    Medications:  Current Outpatient Medications on File Prior to Visit  Medication Sig   acetaminophen (TYLENOL) 500 MG tablet Take 2 tablets (1,000 mg total) by mouth every 6 (six) hours as needed for mild pain.   albuterol (VENTOLIN HFA) 108 (90 Base) MCG/ACT inhaler INHALE 1 PUFF INTO THE LUNGS EVERY 6 HOURS AS NEEDED FOR WHEEZING OR SHORTNESS OF BREATH   diltiazem  (CARDIZEM CD) 180 MG 24 hr capsule Take 180 mg by mouth daily.   diphenhydrAMINE (BENADRYL) 25 MG tablet Take 25 mg by mouth daily as needed for allergies.   ibuprofen (ADVIL) 800 MG tablet Take 1 tablet (800 mg total) by mouth every 8 (eight) hours as needed.   meloxicam (MOBIC) 15 MG tablet Take 1 tablet (15 mg total) by mouth daily.   rosuvastatin (CRESTOR) 5 MG tablet TAKE 1 TABLET(5 MG) BY MOUTH DAILY   No current facility-administered medications on file prior to visit.    Allergies:  Allergies  Allergen Reactions   Penicillins Hives    Social History:  Social History   Socioeconomic History   Marital status: Single    Spouse name: Not on file   Number of children: 3   Years of education: Not on file   Highest education level: Associate degree: occupational, Scientist, product/process development, or vocational program  Occupational History   Not on file  Tobacco Use   Smoking status: Former    Packs/day: 0.25    Years: 25.00    Additional pack years: 0.00    Total pack years: 6.25    Types: Cigarettes    Quit date: 07/02/2021    Years since quitting: 1.3    Passive exposure: Never   Smokeless tobacco: Never  Vaping Use   Vaping Use: Never used  Substance and Sexual Activity   Alcohol use: Not Currently   Drug use: Yes    Types: Marijuana    Comment: on occassion   Sexual activity: Not Currently  Other Topics Concern   Not on file  Social History Narrative   Lives with children   Social Determinants of Health   Financial Resource Strain: Medium Risk (10/27/2022)   Overall Financial Resource Strain (CARDIA)    Difficulty of Paying Living Expenses: Somewhat hard  Food Insecurity: No Food Insecurity (10/27/2022)   Hunger Vital Sign    Worried About Running Out of Food in the Last Year: Never true    Ran Out of Food in the Last Year: Never true  Transportation Needs: No Transportation Needs (10/27/2022)   PRAPARE - Administrator, Civil Service (Medical): No    Lack of  Transportation (Non-Medical): No  Physical Activity: Unknown (10/27/2022)   Exercise Vital Sign    Days of Exercise per Week: 0 days    Minutes of Exercise per Session: Not on file  Stress: No Stress Concern Present (10/27/2022)   [employer] of Occupational Health - Occupational Stress Questionnaire    Feeling of Stress : Only a little  Social Connections: Socially Isolated (10/27/2022)   Social Connection and Isolation Panel [NHANES]    Frequency of Communication with Friends and Family: More than three times a week    Frequency of Social Gatherings with  Friends and Family: Once a week    Attends Religious Services: Never    Database administrator or Organizations: No    Attends Engineer, structural: Not on file    Marital Status: Never married  Catering manager Violence: Not on file   Social History   Tobacco Use  Smoking Status Former   Packs/day: 0.25   Years: 25.00   Additional pack years: 0.00   Total pack years: 6.25   Types: Cigarettes   Quit date: 07/02/2021   Years since quitting: 1.3   Passive exposure: Never  Smokeless Tobacco Never   Social History   Substance and Sexual Activity  Alcohol Use Not Currently    Family History:  Family History  Problem Relation Age of Onset   Diabetes Mother    Hypertension Mother    Heart disease Father    Diabetes Sister    Hypertension Sister    Hypertension Sister    Hypertension Sister    Hypertension Sister    Diabetes Brother    Hypertension Brother    Heart disease Brother    Mental illness Brother    Depression Brother    Diabetes Brother    Hypertension Brother    Hypertension Brother    Asthma Son    Diabetes Maternal Grandmother    Hypertension Paternal Grandmother    Hypertension Paternal Grandfather     Past medical history, surgical history, medications, allergies, family history and social history reviewed with patient today and changes made to appropriate areas of the chart.    Review of Systems  HENT:         Denies vision changes.  Eyes:  Negative for blurred vision and double vision.  Respiratory:  Negative for shortness of breath.   Cardiovascular:  Negative for chest pain, palpitations and leg swelling.  Neurological:  Negative for dizziness, tingling and headaches.  Endo/Heme/Allergies:  Positive for polydipsia.       Denies Polyuria   All other ROS negative except what is listed above and in the HPI.      Objective:    BP 117/70   Pulse 73   Temp 98.6 F (37 C) (Oral)   Wt 209 lb 8 oz (95 kg)   LMP  (LMP Unknown)   SpO2 98%   BMI 38.32 kg/m   Wt Readings from Last 3 Encounters:  10/29/22 209 lb 8 oz (95 kg)  04/20/22 212 lb 11.2 oz (96.5 kg)  03/03/22 207 lb 0.2 oz (93.9 kg)    Physical Exam Vitals and nursing note reviewed.  Constitutional:      General: She is awake. She is not in acute distress.    Appearance: Normal appearance. She is well-developed. She is obese. She is not ill-appearing.  HENT:     Head: Normocephalic and atraumatic.     Right Ear: Hearing, tympanic membrane, ear canal and external ear normal. No drainage.     Left Ear: Hearing, tympanic membrane, ear canal and external ear normal. No drainage.     Nose: Nose normal.     Right Sinus: No maxillary sinus tenderness or frontal sinus tenderness.     Left Sinus: No maxillary sinus tenderness or frontal sinus tenderness.     Mouth/Throat:     Mouth: Mucous membranes are moist.     Pharynx: Oropharynx is clear. Uvula midline. No pharyngeal swelling, oropharyngeal exudate or posterior oropharyngeal erythema.  Eyes:     General: Lids are normal.  Right eye: No discharge.        Left eye: No discharge.     Extraocular Movements: Extraocular movements intact.     Conjunctiva/sclera: Conjunctivae normal.     Pupils: Pupils are equal, round, and reactive to light.     Visual Fields: Right eye visual fields normal and left eye visual fields normal.  Neck:      Thyroid: No thyromegaly.     Vascular: No carotid bruit.     Trachea: Trachea normal.  Cardiovascular:     Rate and Rhythm: Normal rate and regular rhythm.     Heart sounds: Normal heart sounds. No murmur heard.    No gallop.  Pulmonary:     Effort: Pulmonary effort is normal. No accessory muscle usage or respiratory distress.     Breath sounds: Normal breath sounds.  Chest:  Breasts:    Right: Normal.     Left: Normal.  Abdominal:     General: Bowel sounds are normal.     Palpations: Abdomen is soft. There is no hepatomegaly or splenomegaly.     Tenderness: There is no abdominal tenderness.  Musculoskeletal:        General: Normal range of motion.     Cervical back: Normal range of motion and neck supple.     Right lower leg: No edema.     Left lower leg: No edema.  Lymphadenopathy:     Head:     Right side of head: No submental, submandibular, tonsillar, preauricular or posterior auricular adenopathy.     Left side of head: No submental, submandibular, tonsillar, preauricular or posterior auricular adenopathy.     Cervical: No cervical adenopathy.     Upper Body:     Right upper body: No supraclavicular, axillary or pectoral adenopathy.     Left upper body: No supraclavicular, axillary or pectoral adenopathy.  Skin:    General: Skin is warm and dry.     Capillary Refill: Capillary refill takes less than 2 seconds.     Findings: No rash.  Neurological:     Mental Status: She is alert and oriented to person, place, and time.     Gait: Gait is intact.     Deep Tendon Reflexes: Reflexes are normal and symmetric.     Reflex Scores:      Brachioradialis reflexes are 2+ on the right side and 2+ on the left side.      Patellar reflexes are 2+ on the right side and 2+ on the left side. Psychiatric:        Attention and Perception: Attention normal.        Mood and Affect: Mood normal.        Speech: Speech normal.        Behavior: Behavior normal. Behavior is cooperative.         Thought Content: Thought content normal.        Judgment: Judgment normal.     Results for orders placed or performed in visit on 06/10/22  HM MAMMOGRAPHY  Result Value Ref Range   HM Mammogram 0-4 Bi-Rad 0-4 Bi-Rad, Self Reported Normal      Assessment & Plan:   Problem List Items Addressed This Visit       Cardiovascular and Mediastinum   Hypertension    Chronic.  Controlled.  Continue with current medication regimen of Olmesartan  and Cardizem daily. Blood pressures run 120/80s at home.  Labs ordered today.  Refills sent today.  Return to  clinic in 6 months for reevaluation.  Call sooner if concerns arise.        Relevant Medications   olmesartan (BENICAR) 40 MG tablet     Endocrine   Controlled diabetes mellitus type 2 with complications    Chronic. Well controlled on Metformin 500mg  BID and Farxiga.  Labs ordered today.  Last A1c was 8.0.  Sugars have been running 140s. Up to date on Eye exam and Foot exam.  Will make further recommendations based on lab results.  Follow up in 3 months.  Call sooner if concerns arise.       Relevant Medications   dapagliflozin propanediol (FARXIGA) 10 MG TABS tablet   metFORMIN (GLUCOPHAGE) 500 MG tablet   olmesartan (BENICAR) 40 MG tablet   Other Relevant Orders   HgB A1c   Microalbumin, Urine Waived   Hyperlipidemia associated with type 2 diabetes mellitus    Chronic.  Controlled.  Continue with current medication regimen of Rosuvastatin daily.  Labs ordered today.  Return to clinic in 6 months for reevaluation.  Call sooner if concerns arise.        Relevant Medications   dapagliflozin propanediol (FARXIGA) 10 MG TABS tablet   metFORMIN (GLUCOPHAGE) 500 MG tablet   olmesartan (BENICAR) 40 MG tablet   Other Relevant Orders   Lipid panel     Other   Morbid obesity    Recommended eating smaller high protein, low fat meals more frequently and exercising 30 mins a day 5 times a week with a goal of 10-15lb weight loss in the  next 3 months.       Relevant Medications   dapagliflozin propanediol (FARXIGA) 10 MG TABS tablet   metFORMIN (GLUCOPHAGE) 500 MG tablet   Other Visit Diagnoses     Annual physical exam    -  Primary   Health maintenance reviewed during visit today.  Labs ordered.  Reviewed vaccines during visit.  PAP, Mammogram and Colonoscopy are up to date.   Relevant Orders   Microalbumin, Urine Waived   CBC with Differential/Platelet   Comprehensive metabolic panel   Lipid panel   TSH   Urinalysis, Routine w reflex microscopic        Follow up plan: Return in about 3 months (around 01/28/2023) for HTN, HLD, DM2 FU.   LABORATORY TESTING:  - Pap smear: up to date  IMMUNIZATIONS:   - Tdap: Tetanus vaccination status reviewed: last tetanus booster within 10 years. - Influenza: postponed till flu season - Pneumovax:up to date - Prevnar: Not applicable - COVID: Up to date - HPV: Not applicable - Shingrix vaccine: Refused  SCREENING: -Mammogram: Up to date  - Colonoscopy: Up to date  - Bone Density: Not applicable  -Hearing Test: Not applicable  -Spirometry: Not applicable   PATIENT COUNSELING:   Advised to take 1 mg of folate supplement per day if capable of pregnancy.   Sexuality: Discussed sexually transmitted diseases, partner selection, use of condoms, avoidance of unintended pregnancy  and contraceptive alternatives.   Advised to avoid cigarette smoking.  I discussed with the patient that most people either abstain from alcohol or drink within safe limits (<=14/week and <=4 drinks/occasion for males, <=7/weeks and <= 3 drinks/occasion for females) and that the risk for alcohol disorders and other health effects rises proportionally with the number of drinks per week and how often a drinker exceeds daily limits.  Discussed cessation/primary prevention of drug use and availability of treatment for abuse.   Diet: Encouraged to  adjust caloric intake to maintain  or achieve ideal  body weight, to reduce intake of dietary saturated fat and total fat, to limit sodium intake by avoiding high sodium foods and not adding table salt, and to maintain adequate dietary potassium and calcium preferably from fresh fruits, vegetables, and low-fat dairy products.    stressed the importance of regular exercise  Injury prevention: Discussed safety belts, safety helmets, smoke detector, smoking near bedding or upholstery.   Dental health: Discussed importance of regular tooth brushing, flossing, and dental visits.    NEXT PREVENTATIVE PHYSICAL DUE IN 1 YEAR. Return in about 3 months (around 01/28/2023) for HTN, HLD, DM2 FU.

## 2022-10-29 NOTE — Assessment & Plan Note (Signed)
Chronic.  Controlled.  Continue with current medication regimen of Rosuvastatin daily.  Labs ordered today.  Return to clinic in 6 months for reevaluation.  Call sooner if concerns arise.

## 2022-10-29 NOTE — Assessment & Plan Note (Signed)
Chronic. Well controlled on Metformin  BID and Farxiga.  Labs ordered today.  Last A1c was 8.0.  Sugars have been running 140s. Up to date on Eye exam and Foot exam.  Will make further recommendations based on lab results.  Follow up in 3 months.  Call sooner if concerns arise.

## 2022-10-29 NOTE — Assessment & Plan Note (Signed)
Chronic.  Controlled.  Continue with current medication regimen of Olmesartan  and Cardizem daily. Blood pressures run 120/80s at home.  Labs ordered today.  Refills sent today.  Return to clinic in 6 months for reevaluation.  Call sooner if concerns arise.

## 2022-10-30 ENCOUNTER — Telehealth: Payer: Self-pay

## 2022-10-30 LAB — CBC WITH DIFFERENTIAL/PLATELET
Basophils Absolute: 0 10*3/uL (ref 0.0–0.2)
Basos: 0 %
EOS (ABSOLUTE): 0.1 10*3/uL (ref 0.0–0.4)
Eos: 1 %
Hematocrit: 41.8 % (ref 34.0–46.6)
Hemoglobin: 13.7 g/dL (ref 11.1–15.9)
Immature Grans (Abs): 0 10*3/uL (ref 0.0–0.1)
Immature Granulocytes: 0 %
Lymphocytes Absolute: 2.1 10*3/uL (ref 0.7–3.1)
Lymphs: 42 %
MCH: 30 pg (ref 26.6–33.0)
MCHC: 32.8 g/dL (ref 31.5–35.7)
MCV: 92 fL (ref 79–97)
Monocytes Absolute: 0.5 10*3/uL (ref 0.1–0.9)
Monocytes: 9 %
Neutrophils Absolute: 2.4 10*3/uL (ref 1.4–7.0)
Neutrophils: 48 %
Platelets: 184 10*3/uL (ref 150–450)
RBC: 4.56 x10E6/uL (ref 3.77–5.28)
RDW: 14 % (ref 11.7–15.4)
WBC: 4.9 10*3/uL (ref 3.4–10.8)

## 2022-10-30 LAB — LIPID PANEL
Chol/HDL Ratio: 2.7 ratio (ref 0.0–4.4)
Cholesterol, Total: 158 mg/dL (ref 100–199)
HDL: 58 mg/dL (ref >39)
LDL Chol Calc (NIH): 89 mg/dL (ref 0–99)
Triglycerides: 54 mg/dL (ref 0–149)
VLDL Cholesterol Cal: 11 mg/dL (ref 5–40)

## 2022-10-30 LAB — COMPREHENSIVE METABOLIC PANEL
ALT: 49 IU/L — ABNORMAL HIGH (ref 0–32)
AST: 22 IU/L (ref 0–40)
Albumin/Globulin Ratio: 1.8 (ref 1.2–2.2)
Albumin: 4.8 g/dL (ref 3.8–4.9)
Alkaline Phosphatase: 113 IU/L (ref 44–121)
BUN/Creatinine Ratio: 23 (ref 9–23)
BUN: 16 mg/dL (ref 6–24)
Bilirubin Total: 0.4 mg/dL (ref 0.0–1.2)
CO2: 23 mmol/L (ref 20–29)
Calcium: 9.7 mg/dL (ref 8.7–10.2)
Chloride: 104 mmol/L (ref 96–106)
Creatinine, Ser: 0.71 mg/dL (ref 0.57–1.00)
Globulin, Total: 2.6 g/dL (ref 1.5–4.5)
Glucose: 143 mg/dL — ABNORMAL HIGH (ref 70–99)
Potassium: 3.9 mmol/L (ref 3.5–5.2)
Sodium: 141 mmol/L (ref 134–144)
Total Protein: 7.4 g/dL (ref 6.0–8.5)
eGFR: 102 mL/min/{1.73_m2} (ref >59)

## 2022-10-30 LAB — TSH: TSH: 0.674 u[IU]/mL (ref 0.450–4.500)

## 2022-10-30 LAB — HEMOGLOBIN A1C
Est. average glucose Bld gHb Est-mCnc: 183 mg/dL
Hgb A1c MFr Bld: 8 % — ABNORMAL HIGH (ref 4.8–5.6)

## 2022-10-30 MED ORDER — RYBELSUS 7 MG PO TABS: 7.0000 mg | ORAL_TABLET | Freq: Every day | ORAL | 1 refills | Status: DC

## 2022-10-30 NOTE — Progress Notes (Signed)
Please let patietn know that her lab work shows that her A1c is still at 8.0%.  At this point, I think we should add an additional medication.  I recommend starting a once weekly injection called Ozempic.  She can come back in and I can explain to her how to use the medication.  As well as side effects and benefits of the medication.  If she does not want to do an injection we can add Rybelsus which is another daily pill.    Otherwise, her lab work looks good.  No other concerns at this time.

## 2022-10-30 NOTE — Addendum Note (Signed)
Addended by: Larae Grooms on: 10/30/2022 10:00 AM   Modules accepted: Orders

## 2022-10-30 NOTE — Telephone Encounter (Signed)
Pt given lab results per notes of Clydie Braun, NP on 10/30/22. Pt verbalized understanding. Pt prefers to try the Rybelsus and send to [facility] # [id number].   Larae Grooms, NP 10/30/2022  9:23 AM EDT     Please let patietn know that her lab work shows that her A1c is still at 8.0%.  At this point, I think we should add an additional medication.  I recommend starting a once weekly injection called Ozempic.  She can come back in and I can explain to her how to use the medication.  As well as side effects and benefits of the medication.  If she does not want to do an injection we can add Rybelsus which is another daily pill.     Otherwise, her lab work looks good.  No other concerns at this time.

## 2022-10-30 NOTE — Telephone Encounter (Signed)
Medication sent to the pharmacy.

## 2022-11-02 DIAGNOSIS — Z9889 Other specified postprocedural states: Secondary | ICD-10-CM | POA: Diagnosis not present

## 2022-11-02 DIAGNOSIS — R29898 Other symptoms and signs involving the musculoskeletal system: Secondary | ICD-10-CM | POA: Diagnosis not present

## 2022-11-02 DIAGNOSIS — M25511 Pain in right shoulder: Secondary | ICD-10-CM | POA: Diagnosis not present

## 2022-11-02 DIAGNOSIS — M25621 Stiffness of right elbow, not elsewhere classified: Secondary | ICD-10-CM | POA: Diagnosis not present

## 2022-11-16 ENCOUNTER — Ambulatory Visit (INDEPENDENT_AMBULATORY_CARE_PROVIDER_SITE_OTHER): Payer: Federal, State, Local not specified - PPO

## 2022-11-16 DIAGNOSIS — E0840 Diabetes mellitus due to underlying condition with diabetic neuropathy, unspecified: Secondary | ICD-10-CM

## 2022-11-16 NOTE — Progress Notes (Signed)
Patient presents to the office today for diabetic shoe and insole measuring. Patient is not on Medicare and is unsure if insurance will cover diabetic shoes or inserts. Patient is going to contact her insurance and call back for an appointment.   No scans taken.

## 2022-11-27 DIAGNOSIS — M7521 Bicipital tendinitis, right shoulder: Secondary | ICD-10-CM | POA: Diagnosis not present

## 2022-11-27 DIAGNOSIS — M75121 Complete rotator cuff tear or rupture of right shoulder, not specified as traumatic: Secondary | ICD-10-CM | POA: Diagnosis not present

## 2022-11-27 DIAGNOSIS — M24111 Other articular cartilage disorders, right shoulder: Secondary | ICD-10-CM | POA: Diagnosis not present

## 2022-11-27 DIAGNOSIS — M7581 Other shoulder lesions, right shoulder: Secondary | ICD-10-CM | POA: Diagnosis not present

## 2023-01-11 ENCOUNTER — Other Ambulatory Visit: Payer: Self-pay | Admitting: Nurse Practitioner

## 2023-01-11 NOTE — Telephone Encounter (Signed)
Requested Prescriptions  Pending Prescriptions Disp Refills   rosuvastatin (CRESTOR) 5 MG tablet [Pharmacy Med Name: ROSUVASTATIN 5MG  TABLETS] 90 tablet 3    Sig: TAKE 1 TABLET(5 MG) BY MOUTH DAILY     Cardiovascular:  Antilipid - Statins 2 Failed - 01/11/2023  6:31 AM      Failed - Lipid Panel in normal range within the last 12 months    Cholesterol, Total  Date Value Ref Range Status  10/29/2022 158 100 - 199 mg/dL Final   LDL Chol Calc (NIH)  Date Value Ref Range Status  10/29/2022 89 0 - 99 mg/dL Final   HDL  Date Value Ref Range Status  10/29/2022 58 >39 mg/dL Final   Triglycerides  Date Value Ref Range Status  10/29/2022 54 0 - 149 mg/dL Final         Passed - Cr in normal range and within 360 days    Creat  Date Value Ref Range Status  03/18/2021 0.69 0.50 - 1.03 mg/dL Final   Creatinine, Ser  Date Value Ref Range Status  10/29/2022 0.71 0.57 - 1.00 mg/dL Final         Passed - Patient is not pregnant      Passed - Valid encounter within last 12 months    Recent Outpatient Visits           2 months ago Annual physical exam   [place] [facility] Larae Grooms, NP   8 months ago Controlled type 2 diabetes mellitus with complication, without long-term current use of insulin (HCC)   [place] [facility] Larae Grooms, NP   11 months ago Controlled type 2 diabetes mellitus with complication, without long-term current use of insulin (HCC)   [place] [facility] Larae Grooms, NP   1 year ago Primary hypertension   [place] [facility] Vigg, Avanti, MD   1 year ago COVID-19   [place] [facility] Vigg, Avanti, MD       Future Appointments             In 2 weeks Mecum, Oswaldo Conroy, PA-C [facility]

## 2023-01-28 ENCOUNTER — Encounter: Payer: Self-pay | Admitting: Physician Assistant

## 2023-01-28 ENCOUNTER — Ambulatory Visit: Payer: Federal, State, Local not specified - PPO | Admitting: Physician Assistant

## 2023-01-28 VITALS — BP 118/77 | HR 62 | Ht 62.0 in | Wt 204.8 lb

## 2023-01-28 DIAGNOSIS — I10 Essential (primary) hypertension: Secondary | ICD-10-CM

## 2023-01-28 DIAGNOSIS — E1169 Type 2 diabetes mellitus with other specified complication: Secondary | ICD-10-CM | POA: Diagnosis not present

## 2023-01-28 DIAGNOSIS — E785 Hyperlipidemia, unspecified: Secondary | ICD-10-CM | POA: Diagnosis not present

## 2023-01-28 DIAGNOSIS — E118 Type 2 diabetes mellitus with unspecified complications: Secondary | ICD-10-CM | POA: Diagnosis not present

## 2023-01-28 LAB — MICROALBUMIN, URINE WAIVED
Creatinine, Urine Waived: 200 mg/dL (ref 10–300)
Microalb, Ur Waived: 80 mg/L — ABNORMAL HIGH (ref 0–19)

## 2023-01-28 LAB — BAYER DCA HB A1C WAIVED: HB A1C (BAYER DCA - WAIVED): 6.6 % — ABNORMAL HIGH (ref 4.8–5.6)

## 2023-01-28 NOTE — Assessment & Plan Note (Signed)
Chronic, historic condition She is currently taking Rybelsus 7 mg PO every day, Metformin 500 mg pO BID, Farxiga 10 mg PO every day She is checking glucose at home daily - fasting results are usually 90s-110s  Recommend she continues with current diet measures Recheck A1c today- results to dictate further management  Continue current regimen pending results Follow up in 3 months or sooner if concerns arise

## 2023-01-28 NOTE — Assessment & Plan Note (Signed)
Chronic, historic condition Appears well managed with current regimen comprised of Rosuvastatin 5 mg PO every day  Continue current regimen and dietary efforts  Recheck lipid panel in 3 months Follow up in 3 months or sooner if concerns arise

## 2023-01-28 NOTE — Assessment & Plan Note (Addendum)
Chronic, historic condition Appears well managed on current regimen comprised of Olmesartan 40 mg PO every day  Continue current regimen Continue checking BP daily at home for monitoring  Recommend increasing daily exercise for cardiovascular health  Follow up in 6 months or sooner if concerns arise

## 2023-01-28 NOTE — Progress Notes (Signed)
Established Patient Office Visit  Name: Carla Gill   MRN: [id number]    DOB: 1971-03-19   Date:01/28/2023  Today's Provider: Jacquelin Hawking, MHS, PA-C Introduced myself to the patient as a PA-C and provided education on APPs in clinical practice.         Subjective  Chief Complaint  Chief Complaint  Patient presents with   Hyperlipidemia   Hypertension   Diabetes   Medication Refill    Patient is requesting a refill on all her current prescriptions at today's visit.     HPI   HYPERTENSION / HYPERLIPIDEMIA Satisfied with current treatment? no Duration of hypertension: years BP monitoring frequency: a few times a day BP range: avg at home 110-130/70-85 BP medication side effects: no Past BP meds: olmesartan (benicar) Duration of hyperlipidemia: years Cholesterol medication side effects: no Cholesterol supplements: none Past cholesterol medications: rosuvastatin (crestor) Medication compliance: good compliance Aspirin: no Recent stressors: no Recurrent headaches: no Visual changes: no Palpitations: no Dyspnea: no Chest pain: no Lower extremity edema: no Dizzy/lightheaded: no  Diabetes, Type 2 - Last A1c 8.0 - Medications: Rybelsus 7 mg PO every day, Metformin 500 mg pO BID, Farxiga 10 mg PO every day - Compliance: good - Checking BG at home:  twice per day- in AM and PM. AM readings are typically 90s-110s. PM readings are typically 110s-130s  - Diet: She is avoiding fried foods, eats mostly chicken and fish, trying to increase vegetables and reduce starches  - Exercise: she was trying to walk but her feet hurt so this is more difficult  - Eye exam: UTD - Foot exam: UTD - Microalbumin: UTD - Statin: on statin  - PNA vaccine: NA - Denies symptoms of hypoglycemia, polyuria, polydipsia, numbness extremities, foot ulcers/trauma   Patient Active Problem List   Diagnosis Date Noted   Hyperlipidemia associated with type 2 diabetes mellitus (HCC)  02/02/2022   Incarcerated epigastric hernia    Ventral hernia without obstruction or gangrene 04/08/2021   OSA (obstructive sleep apnea) 04/07/2021   Hypertension 04/07/2021   Morbid obesity (HCC) 03/18/2021   Controlled diabetes mellitus type 2 with complications (HCC) 03/18/2021   Mild intermittent asthma 03/18/2021   Snoring 03/18/2021    Past Surgical History:  Procedure Laterality Date   CESAREAN SECTION  1990   CHOLECYSTECTOMY  1993   COLONOSCOPY WITH PROPOFOL N/A 07/14/2021   Procedure: COLONOSCOPY WITH PROPOFOL;  Surgeon: Wyline Mood, MD;  Location: [facility] ENDOSCOPY;  Service: Gastroenterology;  Laterality: N/A;   DILATION AND CURETTAGE OF UTERUS     FOOT SURGERY Left    plantar fasciitis   SHOULDER ARTHROSCOPY WITH SUBACROMIAL DECOMPRESSION, ROTATOR CUFF REPAIR AND BICEP TENDON REPAIR Right 03/03/2022   Procedure: SHOULDER ARTHROSCOPY WITH DEBRIDEMENT, DECOMPRESSION, ROTATOR CUFF REPAIR AND BICEP TENDON REPAIR;  Surgeon: Christena Flake, MD;  Location: [facility] ORS;  Service: Orthopedics;  Laterality: Right;   TUBAL LIGATION     XI ROBOTIC ASSISTED VENTRAL HERNIA N/A 07/17/2021   Procedure: XI ROBOTIC ASSISTED VENTRAL HERNIA;  Surgeon: Henrene Dodge, MD;  Location: [facility] ORS;  Service: General;  Laterality: N/A;    Family History  Problem Relation Age of Onset   Diabetes Mother    Hypertension Mother    Heart disease Father    Diabetes Sister    Hypertension Sister    Hypertension Sister    Hypertension Sister    Hypertension Sister    Diabetes Brother    Hypertension Brother  Heart disease Brother    Mental illness Brother    Depression Brother    Diabetes Brother    Hypertension Brother    Hypertension Brother    Asthma Son    Diabetes Maternal Grandmother    Hypertension Paternal Grandmother    Hypertension Paternal Grandfather     Social History   Tobacco Use   Smoking status: Former    Current packs/day: 0.00    Average packs/day: 0.3 packs/day for 25.0  years (6.3 ttl pk-yrs)    Types: Cigarettes    Start date: 07/02/1996    Quit date: 07/02/2021    Years since quitting: 1.5    Passive exposure: Never   Smokeless tobacco: Never  Substance Use Topics   Alcohol use: Not Currently     Current Outpatient Medications:    albuterol (VENTOLIN HFA) 108 (90 Base) MCG/ACT inhaler, INHALE 1 PUFF INTO THE LUNGS EVERY 6 HOURS AS NEEDED FOR WHEEZING OR SHORTNESS OF BREATH, Disp: 18 g, Rfl: 2   dapagliflozin propanediol (FARXIGA) 10 MG TABS tablet, TAKE 1 TABLET(10 MG) BY MOUTH DAILY BEFORE BREAKFAST, Disp: 90 tablet, Rfl: 1   diltiazem (CARDIZEM CD) 180 MG 24 hr capsule, Take 180 mg by mouth daily., Disp: , Rfl:    diphenhydrAMINE (BENADRYL) 25 MG tablet, Take 25 mg by mouth daily as needed for allergies., Disp: , Rfl:    meloxicam (MOBIC) 15 MG tablet, Take 1 tablet (15 mg total) by mouth daily., Disp: 60 tablet, Rfl: 1   metFORMIN (GLUCOPHAGE) 500 MG tablet, Take 1 tablet (500 mg total) by mouth 2 (two) times daily., Disp: 180 tablet, Rfl: 1   olmesartan (BENICAR) 40 MG tablet, TAKE 1 TABLET(40 MG) BY MOUTH DAILY, Disp: 90 tablet, Rfl: 1   rosuvastatin (CRESTOR) 5 MG tablet, TAKE 1 TABLET(5 MG) BY MOUTH DAILY, Disp: 90 tablet, Rfl: 3   Semaglutide (RYBELSUS) 7 MG TABS, Take 1 tablet (7 mg total) by mouth daily., Disp: 90 tablet, Rfl: 1   acetaminophen (TYLENOL) 500 MG tablet, Take 2 tablets (1,000 mg total) by mouth every 6 (six) hours as needed for mild pain. (Patient not taking: Reported on 01/28/2023), Disp: , Rfl:    ibuprofen (ADVIL) 800 MG tablet, Take 1 tablet (800 mg total) by mouth every 8 (eight) hours as needed. (Patient not taking: Reported on 01/28/2023), Disp: 60 tablet, Rfl: 0  Allergies  Allergen Reactions   Penicillins Hives    I personally reviewed active problem list, medication list, allergies, health maintenance, notes from last encounter, lab results with the patient/caregiver today.   Review of Systems  Eyes:  Negative for  blurred vision and double vision.  Respiratory:  Negative for shortness of breath and wheezing.   Cardiovascular:  Negative for chest pain, palpitations and leg swelling.  Gastrointestinal:  Negative for constipation, diarrhea, nausea and vomiting.  Musculoskeletal:  Positive for myalgias (in feet).  Neurological:  Positive for tingling. Negative for dizziness, loss of consciousness and headaches.      Objective  Vitals:   01/28/23 0921  BP: 118/77  Pulse: 62  SpO2: 98%  Weight: 204 lb 12.8 oz (92.9 kg)  Height: 5\' 2"  (1.575 m)    Body mass index is 37.46 kg/m.  Physical Exam Vitals reviewed.  Constitutional:      General: She is awake.     Appearance: Normal appearance. She is well-developed and well-groomed.  HENT:     Head: Normocephalic and atraumatic.  Cardiovascular:     Rate and Rhythm:  Normal rate and regular rhythm.     Pulses: Normal pulses.          Radial pulses are 2+ on the right side and 2+ on the left side.     Heart sounds: Normal heart sounds. No murmur heard.    No friction rub. No gallop.  Neurological:     Mental Status: She is alert.  Psychiatric:        Behavior: Behavior is cooperative.      No results found for this or any previous visit (from the past 2160 hour(s)).   PHQ2/9:    01/28/2023    9:32 AM 10/29/2022   10:02 AM 04/20/2022   10:53 AM 02/02/2022    4:15 PM 10/21/2021    3:20 PM  Depression screen PHQ 2/9  Decreased Interest 0 0 0 0 0  Down, Depressed, Hopeless 0 0 0 0 0  PHQ - 2 Score 0 0 0 0 0  Altered sleeping 0 2 0 0 0  Tired, decreased energy 0 2 0 0 0  Change in appetite 0 0 0 0 0  Feeling bad or failure about yourself  0 0 0 0 0  Trouble concentrating 0 0 0 0 0  Moving slowly or fidgety/restless 0 0 0 0 0  Suicidal thoughts 0 0 0 0 0  PHQ-9 Score 0 4 0 0 0  Difficult doing work/chores Not difficult at all Not difficult at all Not difficult at all Not difficult at all Not difficult at all      Fall Risk:     01/28/2023    9:32 AM 10/29/2022   10:02 AM 04/20/2022   10:53 AM 02/02/2022    4:15 PM 10/21/2021    3:20 PM  Fall Risk   Falls in the past year? 0 0 0 0 0  Number falls in past yr: 0 0 0 0 0  Injury with Fall? 0 0 0 0 0  Risk for fall due to : No Fall Risks No Fall Risks No Fall Risks No Fall Risks No Fall Risks  Follow up Falls evaluation completed Falls evaluation completed Falls evaluation completed Falls evaluation completed Falls evaluation completed      Functional Status Survey:      Assessment & Plan  Problem List Items Addressed This Visit       Cardiovascular and Mediastinum   Hypertension    Chronic, historic condition Appears well managed on current regimen comprised of Olmesartan 40 mg PO every day  Continue current regimen Continue checking BP daily at home for monitoring  Recommend increasing daily exercise for cardiovascular health  Follow up in 6 months or sooner if concerns arise           Endocrine   Controlled diabetes mellitus type 2 with complications (HCC) - Primary    Chronic, historic condition She is currently taking Rybelsus 7 mg PO every day, Metformin 500 mg pO BID, Farxiga 10 mg PO every day She is checking glucose at home daily - fasting results are usually 90s-110s  Recommend she continues with current diet measures Recheck A1c today- results to dictate further management  Continue current regimen pending results Follow up in 3 months or sooner if concerns arise       Relevant Orders   Bayer DCA Hb A1c Waived (STAT)   Microalbumin, Urine Waived (STAT)   Hyperlipidemia associated with type 2 diabetes mellitus (HCC)    Chronic, historic condition Appears well managed with current regimen  comprised of Rosuvastatin 5 mg PO every day  Continue current regimen and dietary efforts  Recheck lipid panel in 3 months Follow up in 3 months or sooner if concerns arise        Return in about 3 months (around 04/30/2023) for HTN,  Diabetes follow up.   I, Glendene Wyer E Kallan Merrick, PA-C, have reviewed all documentation for this visit. The documentation on 01/28/23 for the exam, diagnosis, procedures, and orders are all accurate and complete.   Jacquelin Hawking, MHS, PA-C [facility]

## 2023-01-30 ENCOUNTER — Other Ambulatory Visit: Payer: Self-pay | Admitting: Nurse Practitioner

## 2023-02-01 NOTE — Telephone Encounter (Signed)
Requested Prescriptions  Pending Prescriptions Disp Refills   FARXIGA 10 MG TABS tablet [Pharmacy Med Name: FARXIGA 10MG  TABLETS] 90 tablet 1    Sig: TAKE 1 TABLET(10 MG) BY MOUTH DAILY BEFORE BREAKFAST     Endocrinology:  Diabetes - SGLT2 Inhibitors Passed - 01/30/2023 12:45 PM      Passed - Cr in normal range and within 360 days    Creat  Date Value Ref Range Status  03/18/2021 0.69 0.50 - 1.03 mg/dL Final   Creatinine, Ser  Date Value Ref Range Status  10/29/2022 0.71 0.57 - 1.00 mg/dL Final         Passed - HBA1C is between 0 and 7.9 and within 180 days    HB A1C (BAYER DCA - WAIVED)  Date Value Ref Range Status  01/28/2023 6.6 (H) 4.8 - 5.6 % Final    Comment:             Prediabetes: 5.7 - 6.4          Diabetes: >6.4          Glycemic control for adults with diabetes: <7.0          Passed - eGFR in normal range and within 360 days    eGFR  Date Value Ref Range Status  10/29/2022 102 >59 mL/min/1.73 Final         Passed - Valid encounter within last 6 months    Recent Outpatient Visits           4 days ago Controlled type 2 diabetes mellitus with complication, without long-term current use of insulin (HCC)   [place] [facility] Mecum, Oswaldo Conroy, PA-C   3 months ago Annual physical exam   [place] [facility] Larae Grooms, NP   9 months ago Controlled type 2 diabetes mellitus with complication, without long-term current use of insulin [facility])   [facility] Larae Grooms, NP   12 months ago Controlled type 2 diabetes mellitus with complication, without long-term current use of insulin (HCC)   [place] [facility] Larae Grooms, NP   1 year ago Primary hypertension   [place] [facility] Vigg, Avanti, MD       Future Appointments             In 3 months Larae Grooms, NP [facility]

## 2023-02-01 NOTE — Progress Notes (Signed)
Your A1c has improved to 6.6 which is excellent. Please continue taking your medications as directed at this time.

## 2023-02-09 ENCOUNTER — Telehealth: Payer: Self-pay | Admitting: Physician Assistant

## 2023-02-09 NOTE — Telephone Encounter (Signed)
Patient came into office to drop off a questionnaire form for disability retirement to be completed and faxed. I am forwarding form to CMA and provider Jacquelin Hawking, PA. Informed patient to allow 48-72 hours before provider responds. Patient request phone call when complete. [phone]. I am placing form in Larae Grooms, NP folder.

## 2023-02-10 NOTE — Telephone Encounter (Signed)
This form needs to be sent to Dr. Joice Lofts. He is the one listed on the paperwork and is who wrote her out of work.

## 2023-02-18 NOTE — Telephone Encounter (Signed)
Called patient left detailed phone message in reference to forms dropped off to be completed by provider. Patient need to have provider Dr. Joice Lofts to complete forms because he is the provider who wrote her out of work. Forms will be at front desk anytime patient wish to pick forms up. Ok for [facility] to relay this information if patient calls back. Patient came by to pick up forms.

## 2023-02-26 DIAGNOSIS — M7521 Bicipital tendinitis, right shoulder: Secondary | ICD-10-CM | POA: Diagnosis not present

## 2023-02-26 DIAGNOSIS — M75121 Complete rotator cuff tear or rupture of right shoulder, not specified as traumatic: Secondary | ICD-10-CM | POA: Diagnosis not present

## 2023-02-26 DIAGNOSIS — M24111 Other articular cartilage disorders, right shoulder: Secondary | ICD-10-CM | POA: Diagnosis not present

## 2023-02-26 DIAGNOSIS — M7581 Other shoulder lesions, right shoulder: Secondary | ICD-10-CM | POA: Diagnosis not present

## 2023-03-24 DIAGNOSIS — M65312 Trigger thumb, left thumb: Secondary | ICD-10-CM | POA: Diagnosis not present

## 2023-04-25 IMAGING — CR DG HAND COMPLETE 3+V*L*
3 series · 3 of 3 positions shown · non-contrast
Comparison: Left wrist series today

CLINICAL DATA: Pain, swelling left hand and wrist

EXAM:
LEFT HAND - COMPLETE 3+ VIEW

[hand ap]
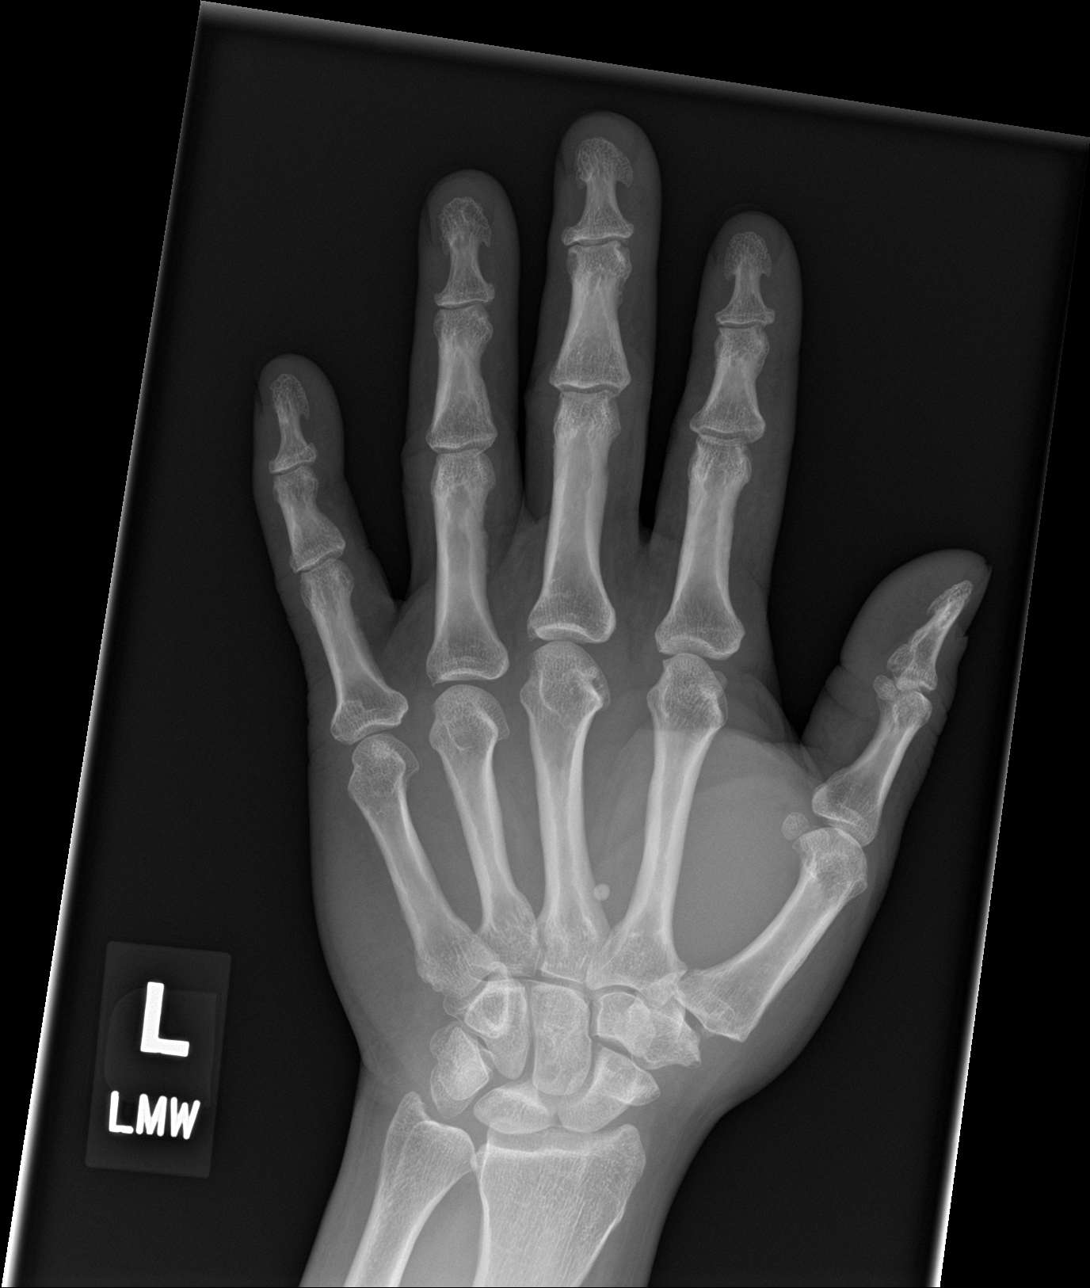

[hand obl]
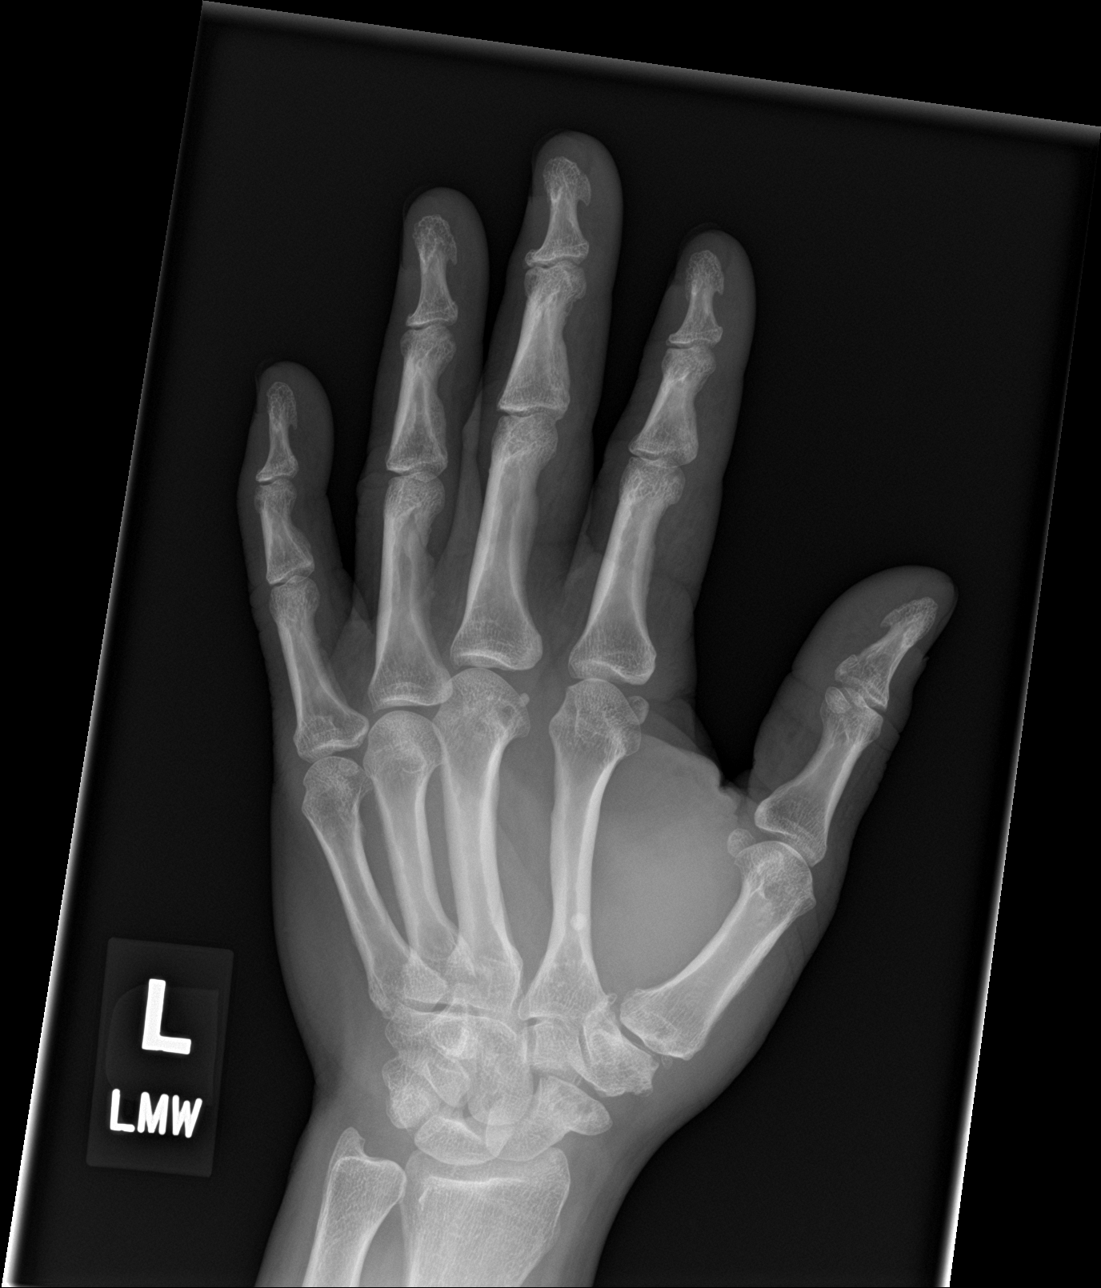

[hand lat]
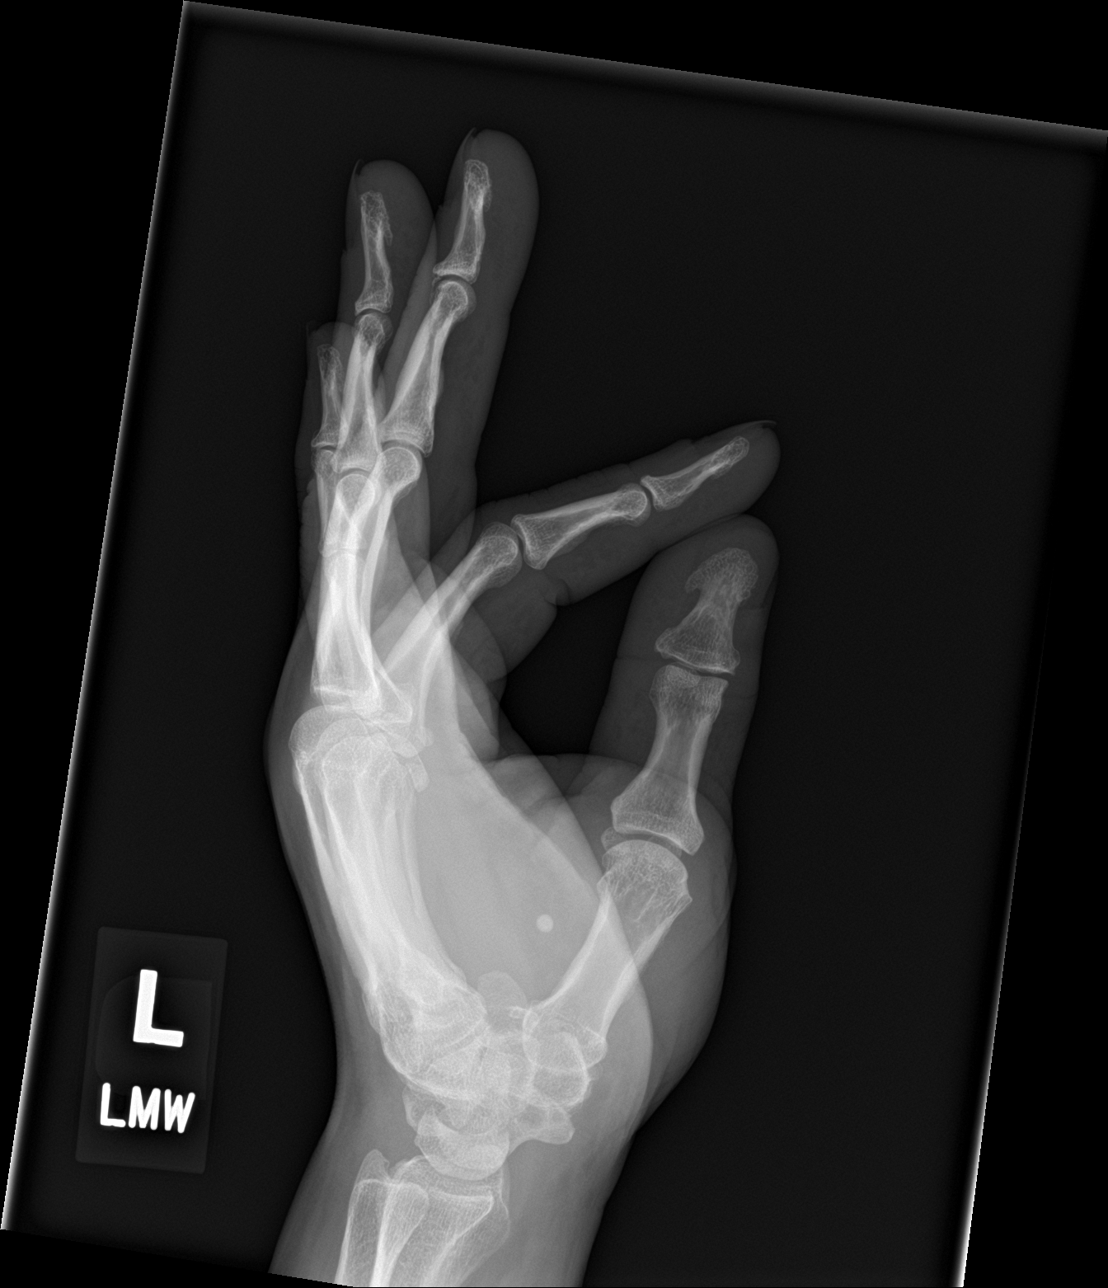

[3 of 3 positions shown; findings below may reference images not displayed]

FINDINGS: Degenerative changes at the 1st carpometacarpal joint. Remainder the
joint spaces are maintained. No acute bony abnormality.
Specifically, no fracture, subluxation, or dislocation. Soft tissues
are unremarkable.
IMPRESSION: No acute bony abnormality.

## 2023-05-03 NOTE — Progress Notes (Unsigned)
There were no vitals taken for this visit.   Subjective:    Patient ID: Carla Gill, female    DOB: 1970-11-30, 52 y.o.   MRN: [id number]  HPI: Carla Gill is a 52 y.o. female  No chief complaint on file.  HYPERTENSION Hypertension status: controlled  Satisfied with current treatment? yes Duration of hypertension: years BP monitoring frequency:  daily BP range: 120-130/80 BP medication side effects:  no Medication compliance: excellent compliance Previous BP meds:amlodipine and olmesartan (benicar) Aspirin: no Recurrent headaches: no Visual changes: no Palpitations: no Dyspnea: no Chest pain: no Lower extremity edema: no Dizzy/lightheaded: no  DIABETES Patient well Carla Gill and Metformin Hypoglycemic episodes:no Polydipsia/polyuria: drinks a lot of water Visual disturbance: no Chest pain: no Paresthesias: no Glucose Monitoring: yes  Accucheck frequency: Daily  Fasting glucose: 130-147  Post prandial:  Evening:  Before meals: Taking Insulin?: no  Long acting insulin:  Short acting insulin: Blood Pressure Monitoring: daily Retinal Examination: Up to Date Foot Exam: Up to date Diabetic Education: Not Completed Pneumovax: Up to Date Influenza: Up to Date Aspirin: no  Relevant past medical, surgical, family and social history reviewed and updated as indicated. Interim medical history since our last visit reviewed. Allergies and medications reviewed and updated.  Review of Systems  Per HPI unless specifically indicated above     Objective:    There were no vitals taken for this visit.  Wt Readings from Last 3 Encounters:  01/28/23 204 lb 12.8 oz (92.9 kg)  10/29/22 209 lb 8 oz (95 kg)  04/20/22 212 lb 11.2 oz (96.5 kg)    Physical Exam  Results for orders placed or performed in visit on 01/28/23  Bayer DCA Hb A1c Waived (STAT)  Result Value Ref Range   HB A1C (BAYER DCA - WAIVED) 6.6 (H) 4.8 - 5.6 %  Microalbumin, Urine Waived (STAT)   Result Value Ref Range   Microalb, Ur Waived 80 (H) 0 - 19 mg/L   Creatinine, Urine Waived 200 10 - 300 mg/dL   Microalb/Creat Ratio 30-300 (H) <30 mg/g      Assessment & Plan:   Problem List Items Addressed This Visit       Cardiovascular and Mediastinum   Hypertension     Endocrine   Controlled diabetes mellitus type 2 with complications (HCC)   Hyperlipidemia associated with type 2 diabetes mellitus (HCC)     Other   Morbid obesity (HCC) - Primary     Follow up plan: No follow-ups on file.

## 2023-05-04 ENCOUNTER — Ambulatory Visit: Payer: Federal, State, Local not specified - PPO | Admitting: Nurse Practitioner

## 2023-05-04 ENCOUNTER — Encounter: Payer: Self-pay | Admitting: Nurse Practitioner

## 2023-05-04 DIAGNOSIS — E118 Type 2 diabetes mellitus with unspecified complications: Secondary | ICD-10-CM

## 2023-05-04 DIAGNOSIS — E1169 Type 2 diabetes mellitus with other specified complication: Secondary | ICD-10-CM

## 2023-05-04 DIAGNOSIS — Z23 Encounter for immunization: Secondary | ICD-10-CM

## 2023-05-04 DIAGNOSIS — I10 Essential (primary) hypertension: Secondary | ICD-10-CM | POA: Diagnosis not present

## 2023-05-04 DIAGNOSIS — E785 Hyperlipidemia, unspecified: Secondary | ICD-10-CM

## 2023-05-04 MED ORDER — ROSUVASTATIN CALCIUM 5 MG PO TABS: ORAL_TABLET | ORAL | 1 refills | Status: DC

## 2023-05-04 MED ORDER — OLMESARTAN MEDOXOMIL 40 MG PO TABS: ORAL_TABLET | ORAL | 1 refills | Status: DC

## 2023-05-04 MED ORDER — DAPAGLIFLOZIN PROPANEDIOL 10 MG PO TABS: 10.0000 mg | ORAL_TABLET | Freq: Every day | ORAL | 1 refills | Status: DC

## 2023-05-04 MED ORDER — RYBELSUS 7 MG PO TABS: 7.0000 mg | ORAL_TABLET | Freq: Every day | ORAL | 1 refills | Status: DC

## 2023-05-04 MED ORDER — METFORMIN HCL 500 MG PO TABS: 500.0000 mg | ORAL_TABLET | Freq: Two times a day (BID) | ORAL | 1 refills | Status: DC

## 2023-05-04 NOTE — Assessment & Plan Note (Signed)
Recommended eating smaller high protein, low fat meals more frequently and exercising 30 mins a day 5 times a week with a goal of 10-15lb weight loss in the next 3 months.  

## 2023-05-04 NOTE — Assessment & Plan Note (Signed)
Chronic.  Controlled.  Continue with current medication regimen of Olmesartan 40mg . Blood pressures run 120/80s at home.  Labs ordered today.  Refills sent today.  Return to clinic in 6 months for reevaluation.  Call sooner if concerns arise.

## 2023-05-04 NOTE — Assessment & Plan Note (Signed)
Chronic.  Controlled.  Continue with current medication regimen of Rosuvastatin daily.  Labs ordered today.  Return to clinic in 6 months for reevaluation.  Call sooner if concerns arise.   

## 2023-05-04 NOTE — Assessment & Plan Note (Signed)
Chronic. Well controlled on Metformin 500mg  BID, Rybelsus and Farxiga.  Labs ordered today.  Last A1c was 6.6%.  Sugars have been running 101-120. Up to date on Eye exam and Foot exam.  Will make further recommendations based on lab results.  Follow up in 6 months.  Call sooner if concerns arise.

## 2023-05-05 LAB — LIPID PANEL
Chol/HDL Ratio: 2.7 ratio (ref 0.0–4.4)
Cholesterol, Total: 166 mg/dL (ref 100–199)
HDL: 62 mg/dL (ref >39)
LDL Chol Calc (NIH): 94 mg/dL (ref 0–99)
Triglycerides: 50 mg/dL (ref 0–149)
VLDL Cholesterol Cal: 10 mg/dL (ref 5–40)

## 2023-05-05 LAB — COMPREHENSIVE METABOLIC PANEL
ALT: 29 [IU]/L (ref 0–32)
AST: 20 [IU]/L (ref 0–40)
Albumin: 4 g/dL (ref 3.8–4.9)
Alkaline Phosphatase: 98 [IU]/L (ref 44–121)
BUN/Creatinine Ratio: 20 (ref 9–23)
BUN: 16 mg/dL (ref 6–24)
Bilirubin Total: 0.3 mg/dL (ref 0.0–1.2)
CO2: 25 mmol/L (ref 20–29)
Calcium: 9.2 mg/dL (ref 8.7–10.2)
Chloride: 106 mmol/L (ref 96–106)
Creatinine, Ser: 0.81 mg/dL (ref 0.57–1.00)
Globulin, Total: 2.6 g/dL (ref 1.5–4.5)
Glucose: 92 mg/dL (ref 70–99)
Potassium: 4.6 mmol/L (ref 3.5–5.2)
Sodium: 144 mmol/L (ref 134–144)
Total Protein: 6.6 g/dL (ref 6.0–8.5)
eGFR: 87 mL/min/{1.73_m2} (ref >59)

## 2023-05-05 LAB — HEMOGLOBIN A1C
Est. average glucose Bld gHb Est-mCnc: 151 mg/dL
Hgb A1c MFr Bld: 6.9 % — ABNORMAL HIGH (ref 4.8–5.6)

## 2023-06-15 DIAGNOSIS — Z01419 Encounter for gynecological examination (general) (routine) without abnormal findings: Secondary | ICD-10-CM | POA: Diagnosis not present

## 2023-06-21 DIAGNOSIS — Z1231 Encounter for screening mammogram for malignant neoplasm of breast: Secondary | ICD-10-CM | POA: Diagnosis not present

## 2023-06-28 DIAGNOSIS — M19011 Primary osteoarthritis, right shoulder: Secondary | ICD-10-CM | POA: Diagnosis not present

## 2023-06-28 DIAGNOSIS — M75121 Complete rotator cuff tear or rupture of right shoulder, not specified as traumatic: Secondary | ICD-10-CM | POA: Diagnosis not present

## 2023-06-28 DIAGNOSIS — M65312 Trigger thumb, left thumb: Secondary | ICD-10-CM | POA: Diagnosis not present

## 2023-06-28 DIAGNOSIS — M7581 Other shoulder lesions, right shoulder: Secondary | ICD-10-CM | POA: Diagnosis not present

## 2023-06-28 DIAGNOSIS — M7521 Bicipital tendinitis, right shoulder: Secondary | ICD-10-CM | POA: Diagnosis not present

## 2023-10-07 DIAGNOSIS — M9903 Segmental and somatic dysfunction of lumbar region: Secondary | ICD-10-CM | POA: Diagnosis not present

## 2023-10-07 DIAGNOSIS — M9902 Segmental and somatic dysfunction of thoracic region: Secondary | ICD-10-CM | POA: Diagnosis not present

## 2023-10-07 DIAGNOSIS — M5416 Radiculopathy, lumbar region: Secondary | ICD-10-CM | POA: Diagnosis not present

## 2023-10-07 DIAGNOSIS — M5414 Radiculopathy, thoracic region: Secondary | ICD-10-CM | POA: Diagnosis not present

## 2023-10-11 DIAGNOSIS — M9902 Segmental and somatic dysfunction of thoracic region: Secondary | ICD-10-CM | POA: Diagnosis not present

## 2023-10-11 DIAGNOSIS — M5414 Radiculopathy, thoracic region: Secondary | ICD-10-CM | POA: Diagnosis not present

## 2023-10-11 DIAGNOSIS — M9903 Segmental and somatic dysfunction of lumbar region: Secondary | ICD-10-CM | POA: Diagnosis not present

## 2023-10-11 DIAGNOSIS — M5416 Radiculopathy, lumbar region: Secondary | ICD-10-CM | POA: Diagnosis not present

## 2023-10-13 DIAGNOSIS — M9903 Segmental and somatic dysfunction of lumbar region: Secondary | ICD-10-CM | POA: Diagnosis not present

## 2023-10-13 DIAGNOSIS — M5414 Radiculopathy, thoracic region: Secondary | ICD-10-CM | POA: Diagnosis not present

## 2023-10-13 DIAGNOSIS — M5416 Radiculopathy, lumbar region: Secondary | ICD-10-CM | POA: Diagnosis not present

## 2023-10-13 DIAGNOSIS — M9902 Segmental and somatic dysfunction of thoracic region: Secondary | ICD-10-CM | POA: Diagnosis not present

## 2023-10-18 DIAGNOSIS — M9903 Segmental and somatic dysfunction of lumbar region: Secondary | ICD-10-CM | POA: Diagnosis not present

## 2023-10-18 DIAGNOSIS — M9902 Segmental and somatic dysfunction of thoracic region: Secondary | ICD-10-CM | POA: Diagnosis not present

## 2023-10-18 DIAGNOSIS — M5414 Radiculopathy, thoracic region: Secondary | ICD-10-CM | POA: Diagnosis not present

## 2023-10-18 DIAGNOSIS — M5416 Radiculopathy, lumbar region: Secondary | ICD-10-CM | POA: Diagnosis not present

## 2023-10-18 LAB — HM DIABETES EYE EXAM

## 2023-10-20 DIAGNOSIS — M9902 Segmental and somatic dysfunction of thoracic region: Secondary | ICD-10-CM | POA: Diagnosis not present

## 2023-10-20 DIAGNOSIS — M9903 Segmental and somatic dysfunction of lumbar region: Secondary | ICD-10-CM | POA: Diagnosis not present

## 2023-10-20 DIAGNOSIS — M5416 Radiculopathy, lumbar region: Secondary | ICD-10-CM | POA: Diagnosis not present

## 2023-10-20 DIAGNOSIS — M5414 Radiculopathy, thoracic region: Secondary | ICD-10-CM | POA: Diagnosis not present

## 2023-10-25 DIAGNOSIS — M5414 Radiculopathy, thoracic region: Secondary | ICD-10-CM | POA: Diagnosis not present

## 2023-10-25 DIAGNOSIS — M5416 Radiculopathy, lumbar region: Secondary | ICD-10-CM | POA: Diagnosis not present

## 2023-10-25 DIAGNOSIS — M9903 Segmental and somatic dysfunction of lumbar region: Secondary | ICD-10-CM | POA: Diagnosis not present

## 2023-10-25 DIAGNOSIS — M9902 Segmental and somatic dysfunction of thoracic region: Secondary | ICD-10-CM | POA: Diagnosis not present

## 2023-10-27 DIAGNOSIS — M9903 Segmental and somatic dysfunction of lumbar region: Secondary | ICD-10-CM | POA: Diagnosis not present

## 2023-10-27 DIAGNOSIS — M9902 Segmental and somatic dysfunction of thoracic region: Secondary | ICD-10-CM | POA: Diagnosis not present

## 2023-10-27 DIAGNOSIS — M5416 Radiculopathy, lumbar region: Secondary | ICD-10-CM | POA: Diagnosis not present

## 2023-10-27 DIAGNOSIS — M5414 Radiculopathy, thoracic region: Secondary | ICD-10-CM | POA: Diagnosis not present

## 2023-11-01 DIAGNOSIS — M5416 Radiculopathy, lumbar region: Secondary | ICD-10-CM | POA: Diagnosis not present

## 2023-11-01 DIAGNOSIS — M9903 Segmental and somatic dysfunction of lumbar region: Secondary | ICD-10-CM | POA: Diagnosis not present

## 2023-11-01 DIAGNOSIS — M9902 Segmental and somatic dysfunction of thoracic region: Secondary | ICD-10-CM | POA: Diagnosis not present

## 2023-11-01 DIAGNOSIS — M5414 Radiculopathy, thoracic region: Secondary | ICD-10-CM | POA: Diagnosis not present

## 2023-11-03 ENCOUNTER — Encounter: Payer: Self-pay | Admitting: Nurse Practitioner

## 2023-11-03 ENCOUNTER — Ambulatory Visit (INDEPENDENT_AMBULATORY_CARE_PROVIDER_SITE_OTHER): Payer: Self-pay | Admitting: Nurse Practitioner

## 2023-11-03 VITALS — BP 124/80 | HR 71 | Temp 98.4°F | Resp 17 | Ht 62.99 in | Wt 212.0 lb

## 2023-11-03 DIAGNOSIS — E1169 Type 2 diabetes mellitus with other specified complication: Secondary | ICD-10-CM | POA: Diagnosis not present

## 2023-11-03 DIAGNOSIS — E118 Type 2 diabetes mellitus with unspecified complications: Secondary | ICD-10-CM

## 2023-11-03 DIAGNOSIS — Z0000 Encounter for general adult medical examination without abnormal findings: Secondary | ICD-10-CM

## 2023-11-03 DIAGNOSIS — E785 Hyperlipidemia, unspecified: Secondary | ICD-10-CM | POA: Diagnosis not present

## 2023-11-03 DIAGNOSIS — M5414 Radiculopathy, thoracic region: Secondary | ICD-10-CM | POA: Diagnosis not present

## 2023-11-03 DIAGNOSIS — E119 Type 2 diabetes mellitus without complications: Secondary | ICD-10-CM | POA: Diagnosis not present

## 2023-11-03 DIAGNOSIS — Z7984 Long term (current) use of oral hypoglycemic drugs: Secondary | ICD-10-CM

## 2023-11-03 DIAGNOSIS — I10 Essential (primary) hypertension: Secondary | ICD-10-CM

## 2023-11-03 DIAGNOSIS — M9903 Segmental and somatic dysfunction of lumbar region: Secondary | ICD-10-CM | POA: Diagnosis not present

## 2023-11-03 DIAGNOSIS — Z23 Encounter for immunization: Secondary | ICD-10-CM

## 2023-11-03 DIAGNOSIS — M9902 Segmental and somatic dysfunction of thoracic region: Secondary | ICD-10-CM | POA: Diagnosis not present

## 2023-11-03 DIAGNOSIS — M5416 Radiculopathy, lumbar region: Secondary | ICD-10-CM | POA: Diagnosis not present

## 2023-11-03 MED ORDER — OLMESARTAN MEDOXOMIL 40 MG PO TABS: ORAL_TABLET | ORAL | 1 refills | Status: DC

## 2023-11-03 MED ORDER — ROSUVASTATIN CALCIUM 5 MG PO TABS: ORAL_TABLET | ORAL | 1 refills | Status: DC

## 2023-11-03 MED ORDER — METFORMIN HCL 500 MG PO TABS: 500.0000 mg | ORAL_TABLET | Freq: Two times a day (BID) | ORAL | 1 refills | Status: DC

## 2023-11-03 MED ORDER — DAPAGLIFLOZIN PROPANEDIOL 10 MG PO TABS: 10.0000 mg | ORAL_TABLET | Freq: Every day | ORAL | 1 refills | Status: DC

## 2023-11-03 MED ORDER — RYBELSUS 7 MG PO TABS: 7.0000 mg | ORAL_TABLET | Freq: Every day | ORAL | 1 refills | Status: DC

## 2023-11-03 NOTE — Assessment & Plan Note (Signed)
 Chronic.  Controlled.  Continue with current medication regimen of Olmesartan  40mg . Blood pressures run 120/80s at home.  Continue to check blood pressures at home.  Labs ordered today.  Refills sent today.  Return to clinic in 6 months for reevaluation.  Call sooner if concerns arise.

## 2023-11-03 NOTE — Assessment & Plan Note (Signed)
Chronic.  Controlled.  Continue with current medication regimen of Rosuvastatin daily.  Labs ordered today.  Return to clinic in 6 months for reevaluation.  Call sooner if concerns arise.   

## 2023-11-03 NOTE — Progress Notes (Signed)
 BP 124/80 (BP Location: Left Arm, Patient Position: Sitting, Cuff Size: Large)   Pulse 71   Temp 98.4 F (36.9 C) (Oral)   Resp 17   Ht 5' 2.99" (1.6 m)   Wt 212 lb (96.2 kg)   LMP 06/15/2023 (Exact Date)   SpO2 98%   BMI 37.56 kg/m    Subjective:    Patient ID: Carla Gill, female    DOB: Aug 04, 1970, 53 y.o.   MRN: [id number]  HPI: Carla Gill is a 53 y.o. female presenting on 11/03/2023 for comprehensive medical examination. Current medical complaints include: None  She currently lives with: Menopausal Symptoms: no  HYPERTENSION Hypertension status: controlled  Satisfied with current treatment? yes Duration of hypertension: years BP monitoring frequency:  daily BP range: 120/80 BP medication side effects:  no Medication compliance: excellent compliance Previous BP meds: Olmesartan  Aspirin: no Recurrent headaches: yes Visual changes: no Palpitations: no Dyspnea: no Chest pain: no Lower extremity edema: no Dizzy/lightheaded: no  DIABETES Patient well Farxiga  and Metformin  and Rybelsus .  Hypoglycemic episodes:no Polydipsia/polyuria: no Visual disturbance: no Chest pain: no Paresthesias: no Glucose Monitoring: yes  Accucheck frequency: Daily  Fasting glucose: 118-130  Post prandial:  Evening:  Before meals: Taking Insulin?: no  Long acting insulin:  Short acting insulin: Blood Pressure Monitoring: daily Retinal Examination: Up to Date Foot Exam:  Up to date Diabetic Education: Not Completed Pneumovax: Up to Date Influenza: Up to Date Aspirin: no  Depression Screen done today and results listed below:     11/03/2023    8:43 AM 05/04/2023   10:58 AM 01/28/2023    9:32 AM 10/29/2022   10:02 AM 04/20/2022   10:53 AM  Depression screen PHQ 2/9  Decreased Interest 0 0 0 0 0  Down, Depressed, Hopeless 0 0 0 0 0  PHQ - 2 Score 0 0 0 0 0  Altered sleeping 0 0 0 2 0  Tired, decreased energy 0 0 0 2 0  Change in appetite 0 0 0 0 0  Feeling bad  or failure about yourself  0 0 0 0 0  Trouble concentrating 0 0 0 0 0  Moving slowly or fidgety/restless 0 0 0 0 0  Suicidal thoughts 0 0 0 0 0  PHQ-9 Score 0 0 0 4 0  Difficult doing work/chores Not difficult at all  Not difficult at all Not difficult at all Not difficult at all    The patient does not have a history of falls. I did complete a risk assessment for falls. A plan of care for falls was documented.   Past Medical History:  Past Medical History:  Diagnosis Date   Arthritis    Asthma    well controlled   Diabetes (HCC)    type 2   Environmental and seasonal allergies    Hypertension    Mixed hyperlipidemia    Obesity    Ventral hernia without obstruction or gangrene     Surgical History:  Past Surgical History:  Procedure Laterality Date   CESAREAN SECTION  1990   CHOLECYSTECTOMY  1993   COLONOSCOPY WITH PROPOFOL  N/A 07/14/2021   Procedure: COLONOSCOPY WITH PROPOFOL ;  Surgeon: Luke Salaam, MD;  Location: [facility] ENDOSCOPY;  Service: Gastroenterology;  Laterality: N/A;   DILATION AND CURETTAGE OF UTERUS     FOOT SURGERY Left    plantar fasciitis   SHOULDER ARTHROSCOPY WITH SUBACROMIAL DECOMPRESSION, ROTATOR CUFF REPAIR AND BICEP TENDON REPAIR Right 03/03/2022   Procedure: SHOULDER ARTHROSCOPY WITH DEBRIDEMENT,  DECOMPRESSION, ROTATOR CUFF REPAIR AND BICEP TENDON REPAIR;  Surgeon: Elner Hahn, MD;  Location: [facility] ORS;  Service: Orthopedics;  Laterality: Right;   TUBAL LIGATION     XI ROBOTIC ASSISTED VENTRAL HERNIA N/A 07/17/2021   Procedure: XI ROBOTIC ASSISTED VENTRAL HERNIA;  Surgeon: Emmalene Hare, MD;  Location: [facility] ORS;  Service: General;  Laterality: N/A;    Medications:  Current Outpatient Medications on File Prior to Visit  Medication Sig   albuterol  (VENTOLIN  HFA) 108 (90 Base) MCG/ACT inhaler INHALE 1 PUFF INTO THE LUNGS EVERY 6 HOURS AS NEEDED FOR WHEEZING OR SHORTNESS OF BREATH   diphenhydrAMINE (BENADRYL) 25 MG tablet Take 25 mg by mouth daily as  needed for allergies.   meloxicam  (MOBIC ) 15 MG tablet Take 1 tablet (15 mg total) by mouth daily.   No current facility-administered medications on file prior to visit.    Allergies:  Allergies  Allergen Reactions   Penicillins Hives    Social History:  Social History   Socioeconomic History   Marital status: Single    Spouse name: Not on file   Number of children: 3   Years of education: Not on file   Highest education level: Some college, no degree  Occupational History   Not on file  Tobacco Use   Smoking status: Former    Current packs/day: 0.00    Average packs/day: 0.3 packs/day for 25.0 years (6.3 ttl pk-yrs)    Types: Cigarettes    Start date: 07/02/1996    Quit date: 07/02/2021    Years since quitting: 2.3    Passive exposure: Never   Smokeless tobacco: Never  Vaping Use   Vaping status: Never Used  Substance and Sexual Activity   Alcohol use: Not Currently   Drug use: Yes    Types: Marijuana    Comment: on occassion   Sexual activity: Not Currently  Other Topics Concern   Not on file  Social History Narrative   Lives with children   Social Drivers of Health   Financial Resource Strain: Low Risk  (11/02/2023)   Overall Financial Resource Strain (CARDIA)    Difficulty of Paying Living Expenses: Not hard at all  Food Insecurity: No Food Insecurity (11/02/2023)   Hunger Vital Sign    Worried About Running Out of Food in the Last Year: Never true    Ran Out of Food in the Last Year: Never true  Transportation Needs: No Transportation Needs (11/02/2023)   PRAPARE - Administrator, Civil Service (Medical): No    Lack of Transportation (Non-Medical): No  Physical Activity: Inactive (11/02/2023)   Exercise Vital Sign    Days of Exercise per Week: 0 days    Minutes of Exercise per Session: 30 min  Stress: No Stress Concern Present (11/02/2023)   [employer] of Occupational Health - Occupational Stress Questionnaire    Feeling of Stress :  Not at all  Social Connections: Socially Isolated (11/02/2023)   Social Connection and Isolation Panel [NHANES]    Frequency of Communication with Friends and Family: More than three times a week    Frequency of Social Gatherings with Friends and Family: Once a week    Attends Religious Services: Never    Database administrator or Organizations: No    Attends Banker Meetings: Not on file    Marital Status: Never married  Intimate Partner Violence: Not At Risk (11/03/2023)   Humiliation, Afraid, Rape, and Kick questionnaire  Fear of Current or Ex-Partner: No    Emotionally Abused: No    Physically Abused: No    Sexually Abused: No   Social History   Tobacco Use  Smoking Status Former   Current packs/day: 0.00   Average packs/day: 0.3 packs/day for 25.0 years (6.3 ttl pk-yrs)   Types: Cigarettes   Start date: 07/02/1996   Quit date: 07/02/2021   Years since quitting: 2.3   Passive exposure: Never  Smokeless Tobacco Never   Social History   Substance and Sexual Activity  Alcohol Use Not Currently    Family History:  Family History  Problem Relation Age of Onset   Diabetes Mother    Hypertension Mother    Heart disease Father    Diabetes Sister    Hypertension Sister    Hypertension Sister    Hypertension Sister    Hypertension Sister    Diabetes Brother    Hypertension Brother    Heart disease Brother    Mental illness Brother    Depression Brother    Diabetes Brother    Hypertension Brother    Hypertension Brother    Asthma Son    Diabetes Maternal Grandmother    Hypertension Paternal Grandmother    Hypertension Paternal Grandfather     Past medical history, surgical history, medications, allergies, family history and social history reviewed with patient today and changes made to appropriate areas of the chart.   Review of Systems  HENT:         Denies vision changes.  Eyes:  Negative for blurred vision and double vision.  Respiratory:   Negative for shortness of breath.   Cardiovascular:  Negative for chest pain, palpitations and leg swelling.  Neurological:  Negative for dizziness, tingling and headaches.  Endo/Heme/Allergies:  Negative for polydipsia.       Denies Polyuria   All other ROS negative except what is listed above and in the HPI.      Objective:    BP 124/80 (BP Location: Left Arm, Patient Position: Sitting, Cuff Size: Large)   Pulse 71   Temp 98.4 F (36.9 C) (Oral)   Resp 17   Ht 5' 2.99" (1.6 m)   Wt 212 lb (96.2 kg)   LMP 06/15/2023 (Exact Date)   SpO2 98%   BMI 37.56 kg/m   Wt Readings from Last 3 Encounters:  11/03/23 212 lb (96.2 kg)  05/04/23 215 lb (97.5 kg)  01/28/23 204 lb 12.8 oz (92.9 kg)    Physical Exam Vitals and nursing note reviewed.  Constitutional:      General: She is awake. She is not in acute distress.    Appearance: Normal appearance. She is well-developed. She is obese. She is not ill-appearing.  HENT:     Head: Normocephalic and atraumatic.     Right Ear: Hearing, tympanic membrane, ear canal and external ear normal. No drainage.     Left Ear: Hearing, tympanic membrane, ear canal and external ear normal. No drainage.     Nose: Nose normal.     Right Sinus: No maxillary sinus tenderness or frontal sinus tenderness.     Left Sinus: No maxillary sinus tenderness or frontal sinus tenderness.     Mouth/Throat:     Mouth: Mucous membranes are moist.     Pharynx: Oropharynx is clear. Uvula midline. No pharyngeal swelling, oropharyngeal exudate or posterior oropharyngeal erythema.  Eyes:     General: Lids are normal.        Right eye:  No discharge.        Left eye: No discharge.     Extraocular Movements: Extraocular movements intact.     Conjunctiva/sclera: Conjunctivae normal.     Pupils: Pupils are equal, round, and reactive to light.     Visual Fields: Right eye visual fields normal and left eye visual fields normal.  Neck:     Thyroid: No thyromegaly.      Vascular: No carotid bruit.     Trachea: Trachea normal.  Cardiovascular:     Rate and Rhythm: Normal rate and regular rhythm.     Heart sounds: Normal heart sounds. No murmur heard.    No gallop.  Pulmonary:     Effort: Pulmonary effort is normal. No accessory muscle usage or respiratory distress.     Breath sounds: Normal breath sounds.  Chest:  Breasts:    Right: Normal.     Left: Normal.  Abdominal:     General: Bowel sounds are normal.     Palpations: Abdomen is soft. There is no hepatomegaly or splenomegaly.     Tenderness: There is no abdominal tenderness.  Musculoskeletal:        General: Normal range of motion.     Cervical back: Normal range of motion and neck supple.     Right lower leg: No edema.     Left lower leg: No edema.  Lymphadenopathy:     Head:     Right side of head: No submental, submandibular, tonsillar, preauricular or posterior auricular adenopathy.     Left side of head: No submental, submandibular, tonsillar, preauricular or posterior auricular adenopathy.     Cervical: No cervical adenopathy.     Upper Body:     Right upper body: No supraclavicular, axillary or pectoral adenopathy.     Left upper body: No supraclavicular, axillary or pectoral adenopathy.  Skin:    General: Skin is warm and dry.     Capillary Refill: Capillary refill takes less than 2 seconds.     Findings: No rash.  Neurological:     Mental Status: She is alert and oriented to person, place, and time.     Gait: Gait is intact.     Deep Tendon Reflexes: Reflexes are normal and symmetric.     Reflex Scores:      Brachioradialis reflexes are 2+ on the right side and 2+ on the left side.      Patellar reflexes are 2+ on the right side and 2+ on the left side. Psychiatric:        Attention and Perception: Attention normal.        Mood and Affect: Mood normal.        Speech: Speech normal.        Behavior: Behavior normal. Behavior is cooperative.        Thought Content: Thought  content normal.        Judgment: Judgment normal.     Results for orders placed or performed in visit on 10/27/23  HM DIABETES EYE EXAM   Collection Time: 10/18/23  2:34 PM  Result Value Ref Range   HM Diabetic Eye Exam No Retinopathy No Retinopathy      Assessment & Plan:   Problem List Items Addressed This Visit       Cardiovascular and Mediastinum   Hypertension   Chronic.  Controlled.  Continue with current medication regimen of Olmesartan  40mg . Blood pressures run 120/80s at home.  Continue to check blood pressures at home.  Labs ordered today.  Refills sent today.  Return to clinic in 6 months for reevaluation.  Call sooner if concerns arise.       Relevant Medications   olmesartan  (BENICAR ) 40 MG tablet   rosuvastatin  (CRESTOR ) 5 MG tablet     Endocrine   Controlled diabetes mellitus type 2 with complications (HCC)   Chronic. Well controlled on Metformin  500mg  BID, Rybelsus  and Farxiga .  Labs ordered today.  Last A1c was 6.9%.  Sugars have been running 101-120. Up to date on Eye exam and Foot exam.  Will make further recommendations based on lab results.  Continue with current medication regimen.  Follow up in 6 months.  Call sooner if concerns arise.       Relevant Medications   dapagliflozin  propanediol (FARXIGA ) 10 MG TABS tablet   metFORMIN  (GLUCOPHAGE ) 500 MG tablet   olmesartan  (BENICAR ) 40 MG tablet   rosuvastatin  (CRESTOR ) 5 MG tablet   Semaglutide  (RYBELSUS ) 7 MG TABS   Other Relevant Orders   Hemoglobin A1c   Hyperlipidemia associated with type 2 diabetes mellitus (HCC)   Chronic.  Controlled.  Continue with current medication regimen of Rosuvastatin  daily.  Labs ordered today.  Return to clinic in 6 months for reevaluation.  Call sooner if concerns arise.       Relevant Medications   dapagliflozin  propanediol (FARXIGA ) 10 MG TABS tablet   metFORMIN  (GLUCOPHAGE ) 500 MG tablet   olmesartan  (BENICAR ) 40 MG tablet   rosuvastatin  (CRESTOR ) 5 MG tablet    Semaglutide  (RYBELSUS ) 7 MG TABS   Other Relevant Orders   Lipid panel   Diabetes mellitus treated with oral medication (HCC)   Relevant Medications   dapagliflozin  propanediol (FARXIGA ) 10 MG TABS tablet   metFORMIN  (GLUCOPHAGE ) 500 MG tablet   olmesartan  (BENICAR ) 40 MG tablet   rosuvastatin  (CRESTOR ) 5 MG tablet   Semaglutide  (RYBELSUS ) 7 MG TABS     Other   Morbid obesity (HCC)   Recommended eating smaller high protein, low fat meals more frequently and exercising 30 mins a day 5 times a week with a goal of 10-15lb weight loss in the next 3 months.       Relevant Medications   dapagliflozin  propanediol (FARXIGA ) 10 MG TABS tablet   metFORMIN  (GLUCOPHAGE ) 500 MG tablet   Semaglutide  (RYBELSUS ) 7 MG TABS   Other Visit Diagnoses       Annual physical exam    -  Primary   Health maintenance reviewed during visit today.  Labs ordered.  Vaccines reviewed.  PAP, mammogram and Colonoscopy up to date.   Relevant Orders   CBC with Differential/Platelet   Comprehensive metabolic panel with GFR   Lipid panel   TSH   Hemoglobin A1c     Need for shingles vaccine       Relevant Orders   Zoster Recombinant (Shingrix )        Follow up plan: No follow-ups on file.   LABORATORY TESTING:  - Pap smear: up to date  IMMUNIZATIONS:   - Tdap: Tetanus vaccination status reviewed: last tetanus booster within 10 years. - Influenza: postponed till flu season - Pneumovax:up to date - Prevnar: Not applicable - COVID: Up to date - HPV: Not applicable - Shingrix vaccine: Refused  SCREENING: -Mammogram: Up to date  - Colonoscopy: Up to date  - Bone Density: Not applicable  -Hearing Test: Not applicable  -Spirometry: Not applicable   PATIENT COUNSELING:   Advised to take 1 mg of folate supplement per  day if capable of pregnancy.   Sexuality: Discussed sexually transmitted diseases, partner selection, use of condoms, avoidance of unintended pregnancy  and contraceptive alternatives.    Advised to avoid cigarette smoking.  I discussed with the patient that most people either abstain from alcohol or drink within safe limits (<=14/week and <=4 drinks/occasion for males, <=7/weeks and <= 3 drinks/occasion for females) and that the risk for alcohol disorders and other health effects rises proportionally with the number of drinks per week and how often a drinker exceeds daily limits.  Discussed cessation/primary prevention of drug use and availability of treatment for abuse.   Diet: Encouraged to adjust caloric intake to maintain  or achieve ideal body weight, to reduce intake of dietary saturated fat and total fat, to limit sodium intake by avoiding high sodium foods and not adding table salt, and to maintain adequate dietary potassium and calcium  preferably from fresh fruits, vegetables, and low-fat dairy products.    stressed the importance of regular exercise  Injury prevention: Discussed safety belts, safety helmets, smoke detector, smoking near bedding or upholstery.   Dental health: Discussed importance of regular tooth brushing, flossing, and dental visits.    NEXT PREVENTATIVE PHYSICAL DUE IN 1 YEAR. No follow-ups on file.

## 2023-11-03 NOTE — Assessment & Plan Note (Signed)
 Recommended eating smaller high protein, low fat meals more frequently and exercising 30 mins a day 5 times a week with a goal of 10-15lb weight loss in the next 3 months.

## 2023-11-03 NOTE — Assessment & Plan Note (Signed)
 Chronic. Well controlled on Metformin  500mg  BID, Rybelsus  and Farxiga .  Labs ordered today.  Last A1c was 6.9%.  Sugars have been running 101-120. Up to date on Eye exam and Foot exam.  Will make further recommendations based on lab results.  Continue with current medication regimen.  Follow up in 6 months.  Call sooner if concerns arise.

## 2023-11-04 ENCOUNTER — Encounter: Payer: Self-pay | Admitting: Nurse Practitioner

## 2023-11-04 LAB — LIPID PANEL
Chol/HDL Ratio: 2.6 ratio (ref 0.0–4.4)
Cholesterol, Total: 172 mg/dL (ref 100–199)
HDL: 65 mg/dL (ref >39)
LDL Chol Calc (NIH): 96 mg/dL (ref 0–99)
Triglycerides: 53 mg/dL (ref 0–149)
VLDL Cholesterol Cal: 11 mg/dL (ref 5–40)

## 2023-11-04 LAB — CBC WITH DIFFERENTIAL/PLATELET
Basophils Absolute: 0 10*3/uL (ref 0.0–0.2)
Basos: 1 %
EOS (ABSOLUTE): 0.1 10*3/uL (ref 0.0–0.4)
Eos: 2 %
Hematocrit: 44.6 % (ref 34.0–46.6)
Hemoglobin: 14.7 g/dL (ref 11.1–15.9)
Immature Grans (Abs): 0 10*3/uL (ref 0.0–0.1)
Immature Granulocytes: 0 %
Lymphocytes Absolute: 2 10*3/uL (ref 0.7–3.1)
Lymphs: 45 %
MCH: 31.5 pg (ref 26.6–33.0)
MCHC: 33 g/dL (ref 31.5–35.7)
MCV: 96 fL (ref 79–97)
Monocytes Absolute: 0.4 10*3/uL (ref 0.1–0.9)
Monocytes: 9 %
Neutrophils Absolute: 1.9 10*3/uL (ref 1.4–7.0)
Neutrophils: 43 %
Platelets: 184 10*3/uL (ref 150–450)
RBC: 4.66 x10E6/uL (ref 3.77–5.28)
RDW: 13.2 % (ref 11.7–15.4)
WBC: 4.3 10*3/uL (ref 3.4–10.8)

## 2023-11-04 LAB — COMPREHENSIVE METABOLIC PANEL WITH GFR
ALT: 34 IU/L — ABNORMAL HIGH (ref 0–32)
AST: 21 IU/L (ref 0–40)
Albumin: 4.6 g/dL (ref 3.8–4.9)
Alkaline Phosphatase: 118 IU/L (ref 44–121)
BUN/Creatinine Ratio: 17 (ref 9–23)
BUN: 12 mg/dL (ref 6–24)
Bilirubin Total: 0.4 mg/dL (ref 0.0–1.2)
CO2: 22 mmol/L (ref 20–29)
Calcium: 9.4 mg/dL (ref 8.7–10.2)
Chloride: 102 mmol/L (ref 96–106)
Creatinine, Ser: 0.72 mg/dL (ref 0.57–1.00)
Globulin, Total: 2.6 g/dL (ref 1.5–4.5)
Glucose: 141 mg/dL — ABNORMAL HIGH (ref 70–99)
Potassium: 4.5 mmol/L (ref 3.5–5.2)
Sodium: 139 mmol/L (ref 134–144)
Total Protein: 7.2 g/dL (ref 6.0–8.5)
eGFR: 99 mL/min/{1.73_m2} (ref >59)

## 2023-11-04 LAB — TSH: TSH: 0.686 u[IU]/mL (ref 0.450–4.500)

## 2023-11-04 LAB — HEMOGLOBIN A1C
Est. average glucose Bld gHb Est-mCnc: 171 mg/dL
Hgb A1c MFr Bld: 7.6 % — ABNORMAL HIGH (ref 4.8–5.6)

## 2023-11-08 DIAGNOSIS — M5414 Radiculopathy, thoracic region: Secondary | ICD-10-CM | POA: Diagnosis not present

## 2023-11-08 DIAGNOSIS — M5416 Radiculopathy, lumbar region: Secondary | ICD-10-CM | POA: Diagnosis not present

## 2023-11-08 DIAGNOSIS — M9902 Segmental and somatic dysfunction of thoracic region: Secondary | ICD-10-CM | POA: Diagnosis not present

## 2023-11-08 DIAGNOSIS — M9903 Segmental and somatic dysfunction of lumbar region: Secondary | ICD-10-CM | POA: Diagnosis not present

## 2023-11-10 DIAGNOSIS — M9903 Segmental and somatic dysfunction of lumbar region: Secondary | ICD-10-CM | POA: Diagnosis not present

## 2023-11-10 DIAGNOSIS — M5414 Radiculopathy, thoracic region: Secondary | ICD-10-CM | POA: Diagnosis not present

## 2023-11-10 DIAGNOSIS — M5416 Radiculopathy, lumbar region: Secondary | ICD-10-CM | POA: Diagnosis not present

## 2023-11-10 DIAGNOSIS — M9902 Segmental and somatic dysfunction of thoracic region: Secondary | ICD-10-CM | POA: Diagnosis not present

## 2023-11-15 DIAGNOSIS — M5416 Radiculopathy, lumbar region: Secondary | ICD-10-CM | POA: Diagnosis not present

## 2023-11-15 DIAGNOSIS — M5414 Radiculopathy, thoracic region: Secondary | ICD-10-CM | POA: Diagnosis not present

## 2023-11-15 DIAGNOSIS — M9903 Segmental and somatic dysfunction of lumbar region: Secondary | ICD-10-CM | POA: Diagnosis not present

## 2023-11-15 DIAGNOSIS — M9902 Segmental and somatic dysfunction of thoracic region: Secondary | ICD-10-CM | POA: Diagnosis not present

## 2023-12-06 DIAGNOSIS — M722 Plantar fascial fibromatosis: Secondary | ICD-10-CM | POA: Diagnosis not present

## 2023-12-06 DIAGNOSIS — B351 Tinea unguium: Secondary | ICD-10-CM | POA: Diagnosis not present

## 2023-12-06 DIAGNOSIS — M216X1 Other acquired deformities of right foot: Secondary | ICD-10-CM | POA: Diagnosis not present

## 2023-12-06 DIAGNOSIS — E119 Type 2 diabetes mellitus without complications: Secondary | ICD-10-CM | POA: Diagnosis not present

## 2023-12-06 DIAGNOSIS — M79671 Pain in right foot: Secondary | ICD-10-CM | POA: Diagnosis not present

## 2023-12-06 DIAGNOSIS — M79672 Pain in left foot: Secondary | ICD-10-CM | POA: Diagnosis not present

## 2023-12-06 DIAGNOSIS — L603 Nail dystrophy: Secondary | ICD-10-CM | POA: Diagnosis not present

## 2024-01-04 DIAGNOSIS — M79671 Pain in right foot: Secondary | ICD-10-CM | POA: Diagnosis not present

## 2024-01-04 DIAGNOSIS — M79672 Pain in left foot: Secondary | ICD-10-CM | POA: Diagnosis not present

## 2024-01-11 DIAGNOSIS — M79672 Pain in left foot: Secondary | ICD-10-CM | POA: Diagnosis not present

## 2024-01-11 DIAGNOSIS — M79671 Pain in right foot: Secondary | ICD-10-CM | POA: Diagnosis not present

## 2024-01-25 DIAGNOSIS — M79672 Pain in left foot: Secondary | ICD-10-CM | POA: Diagnosis not present

## 2024-01-25 DIAGNOSIS — M79671 Pain in right foot: Secondary | ICD-10-CM | POA: Diagnosis not present

## 2024-01-28 DIAGNOSIS — M5416 Radiculopathy, lumbar region: Secondary | ICD-10-CM | POA: Diagnosis not present

## 2024-01-28 DIAGNOSIS — M47816 Spondylosis without myelopathy or radiculopathy, lumbar region: Secondary | ICD-10-CM | POA: Diagnosis not present

## 2024-02-01 DIAGNOSIS — M79671 Pain in right foot: Secondary | ICD-10-CM | POA: Diagnosis not present

## 2024-02-01 DIAGNOSIS — M79672 Pain in left foot: Secondary | ICD-10-CM | POA: Diagnosis not present

## 2024-02-25 ENCOUNTER — Telehealth: Payer: Self-pay

## 2024-02-25 NOTE — Telephone Encounter (Signed)
 Pharmacy Patient Advocate Encounter  Received notification from [facility] that Prior Authorization for Rybelsus  7MG  tablets has been APPROVED  to 8.14.26. Ran test claim, . This test claim was processed through [facility]- copay amounts may vary at other pharmacies due to pharmacy/plan contracts, or as the patient moves through the different stages of their insurance plan.   PA #/Case ID/Reference #: [id number]

## 2024-03-02 DIAGNOSIS — M5416 Radiculopathy, lumbar region: Secondary | ICD-10-CM | POA: Diagnosis not present

## 2024-03-21 DIAGNOSIS — M5416 Radiculopathy, lumbar region: Secondary | ICD-10-CM | POA: Diagnosis not present

## 2024-03-28 ENCOUNTER — Other Ambulatory Visit: Payer: Self-pay | Admitting: Family Medicine

## 2024-03-28 DIAGNOSIS — M5416 Radiculopathy, lumbar region: Secondary | ICD-10-CM

## 2024-03-28 DIAGNOSIS — M47816 Spondylosis without myelopathy or radiculopathy, lumbar region: Secondary | ICD-10-CM | POA: Diagnosis not present

## 2024-04-04 ENCOUNTER — Inpatient Hospital Stay
Admission: RE | Admit: 2024-04-04 | Discharge: 2024-04-04 | Disposition: A | Source: Ambulatory Visit | Attending: Family Medicine | Admitting: Family Medicine

## 2024-04-04 DIAGNOSIS — M4316 Spondylolisthesis, lumbar region: Secondary | ICD-10-CM | POA: Diagnosis not present

## 2024-04-04 DIAGNOSIS — M47816 Spondylosis without myelopathy or radiculopathy, lumbar region: Secondary | ICD-10-CM | POA: Diagnosis not present

## 2024-04-04 DIAGNOSIS — M5416 Radiculopathy, lumbar region: Secondary | ICD-10-CM | POA: Diagnosis not present

## 2024-04-04 DIAGNOSIS — M48061 Spinal stenosis, lumbar region without neurogenic claudication: Secondary | ICD-10-CM | POA: Diagnosis not present

## 2024-04-04 DIAGNOSIS — M5126 Other intervertebral disc displacement, lumbar region: Secondary | ICD-10-CM | POA: Diagnosis not present

## 2024-04-05 DIAGNOSIS — M47816 Spondylosis without myelopathy or radiculopathy, lumbar region: Secondary | ICD-10-CM | POA: Diagnosis not present

## 2024-04-05 DIAGNOSIS — M5416 Radiculopathy, lumbar region: Secondary | ICD-10-CM | POA: Diagnosis not present

## 2024-04-05 DIAGNOSIS — M48061 Spinal stenosis, lumbar region without neurogenic claudication: Secondary | ICD-10-CM | POA: Diagnosis not present

## 2024-04-11 DIAGNOSIS — M5416 Radiculopathy, lumbar region: Secondary | ICD-10-CM | POA: Diagnosis not present

## 2024-04-18 DIAGNOSIS — M5416 Radiculopathy, lumbar region: Secondary | ICD-10-CM | POA: Diagnosis not present

## 2024-04-24 ENCOUNTER — Other Ambulatory Visit (HOSPITAL_COMMUNITY): Payer: Self-pay

## 2024-05-04 ENCOUNTER — Ambulatory Visit: Admitting: Nurse Practitioner

## 2024-05-04 NOTE — Progress Notes (Deleted)
 There were no vitals taken for this visit.   Subjective:    Patient ID: Carla Gill, female    DOB: [birth year], 53 y.o.   MRN: [id number]  HPI: Carla Gill is a 53 y.o. female  No chief complaint on file.  HYPERTENSION Hypertension status: controlled  Satisfied with current treatment? yes Duration of hypertension: years BP monitoring frequency:  daily BP range: 120/80 BP medication side effects:  no Medication compliance: excellent compliance Previous BP meds: Olmesartan  Aspirin: no Recurrent headaches: yes Visual changes: no Palpitations: no Dyspnea: no Chest pain: no Lower extremity edema: no Dizzy/lightheaded: no  DIABETES Patient well Farxiga  and Metformin  and Rybelsus .  Hypoglycemic episodes:no Polydipsia/polyuria: no Visual disturbance: no Chest pain: no Paresthesias: no Glucose Monitoring: yes  Accucheck frequency: Daily  Fasting glucose: 118-130  Post prandial:  Evening:  Before meals: Taking Insulin?: no  Long acting insulin:  Short acting insulin: Blood Pressure Monitoring: daily Retinal Examination: Up to Date Foot Exam: Up to date Diabetic Education: Not Completed Pneumovax: Up to Date Influenza: Up to Date Aspirin: no  Relevant past medical, surgical, family and social history reviewed and updated as indicated. Interim medical history since our last visit reviewed. Allergies and medications reviewed and updated.  Review of Systems  Per HPI unless specifically indicated above     Objective:    There were no vitals taken for this visit.  Wt Readings from Last 3 Encounters:  11/03/23 212 lb (96.2 kg)  05/04/23 215 lb (97.5 kg)  01/28/23 204 lb 12.8 oz (92.9 kg)    Physical Exam  Results for orders placed or performed in visit on 11/03/23  CBC with Differential/Platelet   Collection Time: 11/03/23  9:08 AM  Result Value Ref Range   WBC 4.3 3.4 - 10.8 x10E3/uL   RBC 4.66 3.77 - 5.28 x10E6/uL   Hemoglobin 14.7 11.1 - 15.9  g/dL   Hematocrit 55.3 65.9 - 46.6 %   MCV 96 79 - 97 fL   MCH 31.5 26.6 - 33.0 pg   MCHC 33.0 31.5 - 35.7 g/dL   RDW 86.7 88.2 - 84.5 %   Platelets 184 150 - 450 x10E3/uL   Neutrophils 43 Not Estab. %   Lymphs 45 Not Estab. %   Monocytes 9 Not Estab. %   Eos 2 Not Estab. %   Basos 1 Not Estab. %   Neutrophils Absolute 1.9 1.4 - 7.0 x10E3/uL   Lymphocytes Absolute 2.0 0.7 - 3.1 x10E3/uL   Monocytes Absolute 0.4 0.1 - 0.9 x10E3/uL   EOS (ABSOLUTE) 0.1 0.0 - 0.4 x10E3/uL   Basophils Absolute 0.0 0.0 - 0.2 x10E3/uL   Immature Granulocytes 0 Not Estab. %   Immature Grans (Abs) 0.0 0.0 - 0.1 x10E3/uL  Comprehensive metabolic panel with GFR   Collection Time: 11/03/23  9:08 AM  Result Value Ref Range   Glucose 141 (H) 70 - 99 mg/dL   BUN 12 6 - 24 mg/dL   Creatinine, Ser 9.27 0.57 - 1.00 mg/dL   eGFR 99 >40 fO/fpw/8.26   BUN/Creatinine Ratio 17 9 - 23   Sodium 139 134 - 144 mmol/L   Potassium 4.5 3.5 - 5.2 mmol/L   Chloride 102 96 - 106 mmol/L   CO2 22 20 - 29 mmol/L   Calcium  9.4 8.7 - 10.2 mg/dL   Total Protein 7.2 6.0 - 8.5 g/dL   Albumin 4.6 3.8 - 4.9 g/dL   Globulin, Total 2.6 1.5 - 4.5 g/dL   Bilirubin Total 0.4  0.0 - 1.2 mg/dL   Alkaline Phosphatase 118 44 - 121 IU/L   AST 21 0 - 40 IU/L   ALT 34 (H) 0 - 32 IU/L  Lipid panel   Collection Time: 11/03/23  9:08 AM  Result Value Ref Range   Cholesterol, Total 172 100 - 199 mg/dL   Triglycerides 53 0 - 149 mg/dL   HDL 65 >60 mg/dL   VLDL Cholesterol Cal 11 5 - 40 mg/dL   LDL Chol Calc (NIH) 96 0 - 99 mg/dL   Chol/HDL Ratio 2.6 0.0 - 4.4 ratio  TSH   Collection Time: 11/03/23  9:08 AM  Result Value Ref Range   TSH 0.686 0.450 - 4.500 uIU/mL  Hemoglobin A1c   Collection Time: 11/03/23  9:08 AM  Result Value Ref Range   Hgb A1c MFr Bld 7.6 (H) 4.8 - 5.6 %   Est. average glucose Bld gHb Est-mCnc 171 mg/dL      Assessment & Plan:   Problem List Items Addressed This Visit       Cardiovascular and Mediastinum    Hypertension     Respiratory   OSA (obstructive sleep apnea)     Endocrine   Hyperlipidemia associated with type 2 diabetes mellitus (HCC) - Primary   Diabetes mellitus treated with oral medication (HCC)     Other   Morbid obesity (HCC)     Follow up plan: No follow-ups on file.

## 2024-05-07 ENCOUNTER — Other Ambulatory Visit: Payer: Self-pay | Admitting: Nurse Practitioner

## 2024-05-09 DIAGNOSIS — M5416 Radiculopathy, lumbar region: Secondary | ICD-10-CM | POA: Diagnosis not present

## 2024-05-09 NOTE — Telephone Encounter (Signed)
 Requested medications are due for refill today.  yes  Requested medications are on the active medications list.  yes  Last refill. 11/03/2023 #180 1 rf  Future visit scheduled.   yes  Notes to clinic.  Labs are expired.    Requested Prescriptions  Pending Prescriptions Disp Refills   metFORMIN  (GLUCOPHAGE ) 500 MG tablet [Pharmacy Med Name: METFORMIN  500MG  TABLETS] 180 tablet 1    Sig: TAKE 1 TABLET(500 MG) BY MOUTH TWICE DAILY     Endocrinology:  Diabetes - Biguanides Failed - 05/09/2024 12:35 PM      Failed - HBA1C is between 0 and 7.9 and within 180 days    HB A1C (BAYER DCA - WAIVED)  Date Value Ref Range Status  01/28/2023 6.6 (H) 4.8 - 5.6 % Final    Comment:             Prediabetes: 5.7 - 6.4          Diabetes: >6.4          Glycemic control for adults with diabetes: <7.0    Hgb A1c MFr Bld  Date Value Ref Range Status  11/03/2023 7.6 (H) 4.8 - 5.6 % Final    Comment:             Prediabetes: 5.7 - 6.4          Diabetes: >6.4          Glycemic control for adults with diabetes: <7.0          Failed - B12 Level in normal range and within 720 days    No results found for: VITAMINB12       Failed - Valid encounter within last 6 months    Recent Outpatient Visits           6 months ago Annual physical exam   [facility] Melvin Pao, NP              Passed - Cr in normal range and within 360 days    Creat  Date Value Ref Range Status  03/18/2021 0.69 0.50 - 1.03 mg/dL Final   Creatinine, Ser  Date Value Ref Range Status  11/03/2023 0.72 0.57 - 1.00 mg/dL Final         Passed - eGFR in normal range and within 360 days    eGFR  Date Value Ref Range Status  11/03/2023 99 >59 mL/min/1.73 Final         Passed - CBC within normal limits and completed in the last 12 months    WBC  Date Value Ref Range Status  11/03/2023 4.3 3.4 - 10.8 x10E3/uL Final  02/23/2022 5.2 4.0 - 10.5 K/uL Final   RBC  Date Value Ref Range  Status  11/03/2023 4.66 3.77 - 5.28 x10E6/uL Final  02/23/2022 3.98 3.87 - 5.11 MIL/uL Final   Hemoglobin  Date Value Ref Range Status  11/03/2023 14.7 11.1 - 15.9 g/dL Final   Hematocrit  Date Value Ref Range Status  11/03/2023 44.6 34.0 - 46.6 % Final   MCHC  Date Value Ref Range Status  11/03/2023 33.0 31.5 - 35.7 g/dL Final  91/78/7976 68.4 30.0 - 36.0 g/dL Final   [facility]  Date Value Ref Range Status  11/03/2023 31.5 26.6 - 33.0 pg Final  02/23/2022 29.9 26.0 - 34.0 pg Final   MCV  Date Value Ref Range Status  11/03/2023 96 79 - 97 fL Final   No results found for: PLTCOUNTKUC, LABPLAT,  POCPLA RDW  Date Value Ref Range Status  11/03/2023 13.2 11.7 - 15.4 % Final

## 2024-05-16 ENCOUNTER — Ambulatory Visit: Admitting: Nurse Practitioner

## 2024-05-16 ENCOUNTER — Encounter: Payer: Self-pay | Admitting: Nurse Practitioner

## 2024-05-16 VITALS — BP 136/81 | HR 71 | Temp 97.8°F | Ht 63.0 in | Wt 214.4 lb

## 2024-05-16 DIAGNOSIS — Z7984 Long term (current) use of oral hypoglycemic drugs: Secondary | ICD-10-CM

## 2024-05-16 DIAGNOSIS — Z23 Encounter for immunization: Secondary | ICD-10-CM

## 2024-05-16 DIAGNOSIS — E119 Type 2 diabetes mellitus without complications: Secondary | ICD-10-CM | POA: Diagnosis not present

## 2024-05-16 DIAGNOSIS — E118 Type 2 diabetes mellitus with unspecified complications: Secondary | ICD-10-CM

## 2024-05-16 DIAGNOSIS — I10 Essential (primary) hypertension: Secondary | ICD-10-CM

## 2024-05-16 DIAGNOSIS — E785 Hyperlipidemia, unspecified: Secondary | ICD-10-CM | POA: Diagnosis not present

## 2024-05-16 DIAGNOSIS — E1169 Type 2 diabetes mellitus with other specified complication: Secondary | ICD-10-CM

## 2024-05-16 DIAGNOSIS — Z1231 Encounter for screening mammogram for malignant neoplasm of breast: Secondary | ICD-10-CM

## 2024-05-16 LAB — MICROALBUMIN, URINE WAIVED
Creatinine, Urine Waived: 100 mg/dL (ref 10–300)
Microalb, Ur Waived: 30 mg/L — ABNORMAL HIGH (ref 0–19)
Microalb/Creat Ratio: <30 mg/g (ref <30)

## 2024-05-16 MED ORDER — METFORMIN HCL 500 MG PO TABS: 500.0000 mg | ORAL_TABLET | Freq: Two times a day (BID) | ORAL | 1 refills | Status: AC

## 2024-05-16 MED ORDER — ROSUVASTATIN CALCIUM 5 MG PO TABS: ORAL_TABLET | ORAL | 1 refills | Status: AC

## 2024-05-16 MED ORDER — OLMESARTAN MEDOXOMIL 40 MG PO TABS: ORAL_TABLET | ORAL | 1 refills | Status: AC

## 2024-05-16 MED ORDER — DAPAGLIFLOZIN PROPANEDIOL 10 MG PO TABS: 10.0000 mg | ORAL_TABLET | Freq: Every day | ORAL | 1 refills | Status: AC

## 2024-05-16 NOTE — Assessment & Plan Note (Signed)
 Recommended eating smaller high protein, low fat meals more frequently and exercising 30 mins a day 5 times a week with a goal of 10-15lb weight loss in the next 3 months.

## 2024-05-16 NOTE — Assessment & Plan Note (Addendum)
 Chronic.  Last A1c was 7.6%.  Sugars have been elevated at home.  Currently on Metformin  500mg  BID, and Farxiga .  Labs ordered today.  Last A1c was 7.6%.  Patient is working with a control and instrumentation engineer on diet and exercise.  Up to date on Eye exam and Foot exam.  Will make further recommendations based on lab results.  Continue with current medication regimen.  Follow up in 3 months.  Call sooner if concerns arise.

## 2024-05-16 NOTE — Assessment & Plan Note (Signed)
Chronic.  Controlled.  Continue with current medication regimen of Rosuvastatin daily.  Refills sent today.  Labs ordered today.  Return to clinic in 6 months for reevaluation.  Call sooner if concerns arise.

## 2024-05-16 NOTE — Assessment & Plan Note (Signed)
 Chronic.  Controlled.  Continue with current medication regimen of Olmesartan  40mg . Blood pressures run 120/80s at home.  Continue to check blood pressures at home.  Labs ordered today.  Refills sent today.  Return to clinic in 6 months for reevaluation.  Call sooner if concerns arise.

## 2024-05-16 NOTE — Progress Notes (Unsigned)
 BP 136/81   Pulse 71   Temp 97.8 F (36.6 C) (Oral)   Ht 5' 3 (1.6 m)   Wt 214 lb 6.4 oz (97.3 kg)   SpO2 98%   BMI 37.98 kg/m    Subjective:    Patient ID: Carla Gill, female    DOB: [birth year], 53 y.o.   MRN: [id number]  HPI: Carla Gill is a 53 y.o. female  Chief Complaint  Patient presents with   Diabetes   Hyperlipidemia   Hypertension   HYPERTENSION Hypertension status: controlled  Satisfied with current treatment? yes Duration of hypertension: years BP monitoring frequency:  daily BP range: 120/80 BP medication side effects:  no Medication compliance: excellent compliance Previous BP meds: Olmesartan  Aspirin: no Recurrent headaches: yes Visual changes: no Palpitations: no Dyspnea: no Chest pain: no Lower extremity edema: no Dizzy/lightheaded: no  DIABETES Patient well Farxiga  and Metformin  and Rybelsus . Patient states when she was taking the Rybelsus  she states it was making her stomach cramp.  Last A1c was 7.6%.  She has been out of the Rybelsus  for 1 month.  She has been working with a weight management program.  Hypoglycemic episodes:no Polydipsia/polyuria: no Visual disturbance: no Chest pain: no Paresthesias: no Glucose Monitoring: yes  Accucheck frequency: Daily  Fasting glucose: 130-170  Post prandial:  Evening:  Before meals: Taking Insulin?: no  Long acting insulin:  Short acting insulin: Blood Pressure Monitoring: daily Retinal Examination: Up to Date Foot Exam: Up to date Diabetic Education: Not Completed Pneumovax: Up to Date Influenza: Up to Date Aspirin: no  Relevant past medical, surgical, family and social history reviewed and updated as indicated. Interim medical history since our last visit reviewed. Allergies and medications reviewed and updated.  Review of Systems  Eyes:  Negative for visual disturbance.  Respiratory:  Negative for cough, chest tightness and shortness of breath.   Cardiovascular:  Negative  for chest pain, palpitations and leg swelling.  Endocrine: Negative for polydipsia and polyuria.  Neurological:  Negative for dizziness, numbness and headaches.    Per HPI unless specifically indicated above     Objective:    BP 136/81   Pulse 71   Temp 97.8 F (36.6 C) (Oral)   Ht 5' 3 (1.6 m)   Wt 214 lb 6.4 oz (97.3 kg)   SpO2 98%   BMI 37.98 kg/m   Wt Readings from Last 3 Encounters:  05/16/24 214 lb 6.4 oz (97.3 kg)  11/03/23 212 lb (96.2 kg)  05/04/23 215 lb (97.5 kg)    Physical Exam Vitals and nursing note reviewed.  Constitutional:      General: She is not in acute distress.    Appearance: Normal appearance. She is obese. She is not ill-appearing, toxic-appearing or diaphoretic.  HENT:     Head: Normocephalic.     Right Ear: External ear normal.     Left Ear: External ear normal.     Nose: Nose normal.     Mouth/Throat:     Mouth: Mucous membranes are moist.     Pharynx: Oropharynx is clear.  Eyes:     General:        Right eye: No discharge.        Left eye: No discharge.     Extraocular Movements: Extraocular movements intact.     Conjunctiva/sclera: Conjunctivae normal.     Pupils: Pupils are equal, round, and reactive to light.  Cardiovascular:     Rate and Rhythm: Normal rate and regular rhythm.  Heart sounds: No murmur heard. Pulmonary:     Effort: Pulmonary effort is normal. No respiratory distress.     Breath sounds: Normal breath sounds. No wheezing or rales.  Musculoskeletal:     Cervical back: Normal range of motion and neck supple.  Skin:    General: Skin is warm and dry.     Capillary Refill: Capillary refill takes less than 2 seconds.  Neurological:     General: No focal deficit present.     Mental Status: She is alert and oriented to person, place, and time. Mental status is at baseline.  Psychiatric:        Mood and Affect: Mood normal.        Behavior: Behavior normal.        Thought Content: Thought content normal.         Judgment: Judgment normal.     Results for orders placed or performed in visit on 11/03/23  CBC with Differential/Platelet   Collection Time: 11/03/23  9:08 AM  Result Value Ref Range   WBC 4.3 3.4 - 10.8 x10E3/uL   RBC 4.66 3.77 - 5.28 x10E6/uL   Hemoglobin 14.7 11.1 - 15.9 g/dL   Hematocrit 55.3 65.9 - 46.6 %   MCV 96 79 - 97 fL   MCH 31.5 26.6 - 33.0 pg   MCHC 33.0 31.5 - 35.7 g/dL   RDW 86.7 88.2 - 84.5 %   Platelets 184 150 - 450 x10E3/uL   Neutrophils 43 Not Estab. %   Lymphs 45 Not Estab. %   Monocytes 9 Not Estab. %   Eos 2 Not Estab. %   Basos 1 Not Estab. %   Neutrophils Absolute 1.9 1.4 - 7.0 x10E3/uL   Lymphocytes Absolute 2.0 0.7 - 3.1 x10E3/uL   Monocytes Absolute 0.4 0.1 - 0.9 x10E3/uL   EOS (ABSOLUTE) 0.1 0.0 - 0.4 x10E3/uL   Basophils Absolute 0.0 0.0 - 0.2 x10E3/uL   Immature Granulocytes 0 Not Estab. %   Immature Grans (Abs) 0.0 0.0 - 0.1 x10E3/uL  Comprehensive metabolic panel with GFR   Collection Time: 11/03/23  9:08 AM  Result Value Ref Range   Glucose 141 (H) 70 - 99 mg/dL   BUN 12 6 - 24 mg/dL   Creatinine, Ser 9.27 0.57 - 1.00 mg/dL   eGFR 99 >40 fO/fpw/8.26   BUN/Creatinine Ratio 17 9 - 23   Sodium 139 134 - 144 mmol/L   Potassium 4.5 3.5 - 5.2 mmol/L   Chloride 102 96 - 106 mmol/L   CO2 22 20 - 29 mmol/L   Calcium  9.4 8.7 - 10.2 mg/dL   Total Protein 7.2 6.0 - 8.5 g/dL   Albumin 4.6 3.8 - 4.9 g/dL   Globulin, Total 2.6 1.5 - 4.5 g/dL   Bilirubin Total 0.4 0.0 - 1.2 mg/dL   Alkaline Phosphatase 118 44 - 121 IU/L   AST 21 0 - 40 IU/L   ALT 34 (H) 0 - 32 IU/L  Lipid panel   Collection Time: 11/03/23  9:08 AM  Result Value Ref Range   Cholesterol, Total 172 100 - 199 mg/dL   Triglycerides 53 0 - 149 mg/dL   HDL 65 >60 mg/dL   VLDL Cholesterol Cal 11 5 - 40 mg/dL   LDL Chol Calc (NIH) 96 0 - 99 mg/dL   Chol/HDL Ratio 2.6 0.0 - 4.4 ratio  TSH   Collection Time: 11/03/23  9:08 AM  Result Value Ref Range   TSH 0.686 0.450 -  4.500 uIU/mL   Hemoglobin A1c   Collection Time: 11/03/23  9:08 AM  Result Value Ref Range   Hgb A1c MFr Bld 7.6 (H) 4.8 - 5.6 %   Est. average glucose Bld gHb Est-mCnc 171 mg/dL      Assessment & Plan:   Problem List Items Addressed This Visit       Cardiovascular and Mediastinum   Hypertension   Chronic.  Controlled.  Continue with current medication regimen of Olmesartan  40mg . Blood pressures run 120/80s at home.  Continue to check blood pressures at home.  Labs ordered today.  Refills sent today.  Return to clinic in 6 months for reevaluation.  Call sooner if concerns arise.       Relevant Medications   olmesartan  (BENICAR ) 40 MG tablet   rosuvastatin  (CRESTOR ) 5 MG tablet     Endocrine   Controlled diabetes mellitus type 2 with complications (HCC)   Chronic.  Last A1c was 7.6%.  Sugars have been elevated at home.  Currently on Metformin  500mg  BID, and Farxiga .  Labs ordered today.  Last A1c was 7.6%.  Patient is working with a control and instrumentation engineer on diet and exercise.  Up to date on Eye exam and Foot exam.  Will make further recommendations based on lab results.  Continue with current medication regimen.  Follow up in 3 months.  Call sooner if concerns arise.       Relevant Medications   dapagliflozin  propanediol (FARXIGA ) 10 MG TABS tablet   metFORMIN  (GLUCOPHAGE ) 500 MG tablet   olmesartan  (BENICAR ) 40 MG tablet   rosuvastatin  (CRESTOR ) 5 MG tablet   Hyperlipidemia associated with type 2 diabetes mellitus (HCC) - Primary   Chronic.  Controlled.  Continue with current medication regimen of Rosuvastatin  daily.  Refills sent today.  Labs ordered today.  Return to clinic in 6 months for reevaluation.  Call sooner if concerns arise.       Relevant Medications   dapagliflozin  propanediol (FARXIGA ) 10 MG TABS tablet   metFORMIN  (GLUCOPHAGE ) 500 MG tablet   olmesartan  (BENICAR ) 40 MG tablet   rosuvastatin  (CRESTOR ) 5 MG tablet   Other Relevant Orders   Comprehensive metabolic panel with GFR    Lipid panel   Diabetes mellitus treated with oral medication (HCC)   Relevant Medications   dapagliflozin  propanediol (FARXIGA ) 10 MG TABS tablet   metFORMIN  (GLUCOPHAGE ) 500 MG tablet   olmesartan  (BENICAR ) 40 MG tablet   rosuvastatin  (CRESTOR ) 5 MG tablet   Other Relevant Orders   Hemoglobin A1c   Microalbumin, Urine Waived     Other   Morbid obesity (HCC)   Recommended eating smaller high protein, low fat meals more frequently and exercising 30 mins a day 5 times a week with a goal of 10-15lb weight loss in the next 3 months.       Relevant Medications   dapagliflozin  propanediol (FARXIGA ) 10 MG TABS tablet   metFORMIN  (GLUCOPHAGE ) 500 MG tablet   Other Visit Diagnoses       Need for influenza vaccination       Relevant Orders   Flu vaccine trivalent PF, 6mos and older(Flulaval,Afluria,Fluarix,Fluzone) (Completed)     Need for shingles vaccine       Relevant Orders   Zoster Recombinant (Shingrix  ) (Completed)     Encounter for screening mammogram for malignant neoplasm of breast       Relevant Orders   MM 3D SCREENING MAMMOGRAM BILATERAL BREAST        Follow up plan:  Return in about 3 months (around 08/16/2024) for HTN, HLD, DM2 FU.

## 2024-05-17 ENCOUNTER — Ambulatory Visit: Payer: Self-pay | Admitting: Nurse Practitioner

## 2024-05-17 LAB — LIPID PANEL
Chol/HDL Ratio: 2.7 ratio (ref 0.0–4.4)
Cholesterol, Total: 175 mg/dL (ref 100–199)
HDL: 66 mg/dL (ref >39)
LDL Chol Calc (NIH): 97 mg/dL (ref 0–99)
Triglycerides: 60 mg/dL (ref 0–149)
VLDL Cholesterol Cal: 12 mg/dL (ref 5–40)

## 2024-05-17 LAB — COMPREHENSIVE METABOLIC PANEL WITH GFR
ALT: 46 IU/L — ABNORMAL HIGH (ref 0–32)
AST: 27 IU/L (ref 0–40)
Albumin: 4.8 g/dL (ref 3.8–4.9)
Alkaline Phosphatase: 116 IU/L (ref 49–135)
BUN/Creatinine Ratio: 11 (ref 9–23)
BUN: 10 mg/dL (ref 6–24)
Bilirubin Total: 0.3 mg/dL (ref 0.0–1.2)
CO2: 24 mmol/L (ref 20–29)
Calcium: 9.9 mg/dL (ref 8.7–10.2)
Chloride: 101 mmol/L (ref 96–106)
Creatinine, Ser: 0.88 mg/dL (ref 0.57–1.00)
Globulin, Total: 2.7 g/dL (ref 1.5–4.5)
Glucose: 115 mg/dL — ABNORMAL HIGH (ref 70–99)
Potassium: 4.3 mmol/L (ref 3.5–5.2)
Sodium: 140 mmol/L (ref 134–144)
Total Protein: 7.5 g/dL (ref 6.0–8.5)
eGFR: 78 mL/min/1.73 (ref >59)

## 2024-05-17 LAB — HEMOGLOBIN A1C
Est. average glucose Bld gHb Est-mCnc: 183 mg/dL
Hgb A1c MFr Bld: 8 % — ABNORMAL HIGH (ref 4.8–5.6)

## 2024-06-05 ENCOUNTER — Inpatient Hospital Stay: Admission: RE | Admit: 2024-06-05 | Source: Ambulatory Visit

## 2024-06-21 ENCOUNTER — Encounter: Payer: Self-pay | Admitting: Nurse Practitioner

## 2024-06-21 ENCOUNTER — Ambulatory Visit: Admitting: Nurse Practitioner

## 2024-06-21 VITALS — BP 143/82 | HR 66 | Temp 98.5°F | Resp 17 | Ht 62.99 in | Wt 213.8 lb

## 2024-06-21 DIAGNOSIS — Z1231 Encounter for screening mammogram for malignant neoplasm of breast: Secondary | ICD-10-CM | POA: Diagnosis not present

## 2024-06-21 DIAGNOSIS — L731 Pseudofolliculitis barbae: Secondary | ICD-10-CM | POA: Diagnosis not present

## 2024-06-21 DIAGNOSIS — M79622 Pain in left upper arm: Secondary | ICD-10-CM | POA: Diagnosis not present

## 2024-06-21 LAB — HM MAMMOGRAPHY

## 2024-06-21 NOTE — Progress Notes (Signed)
 BP (!) 143/82 (BP Location: Left Arm, Patient Position: Sitting, Cuff Size: Large)   Pulse 66   Temp 98.5 F (36.9 C) (Oral)   Resp 17   Ht 5' 2.99 (1.6 m)   Wt 213 lb 12.8 oz (97 kg)   LMP 06/25/2023 (Approximate)   SpO2 97%   BMI 37.88 kg/m    Subjective:    Patient ID: Carla Gill, female    DOB: 1970-08-01, 53 y.o.   MRN: [id number]  HPI: Carla Gill is a 53 y.o. female  Chief Complaint  Patient presents with   Pain    Pain under arms. Right side has ingrown hair. Derm said she needs a referral.    Patient states she has an ingrown hair under her right arm.  She states this has been there for about a month.  Does have pus coming out when she squeezes it and pain when she is touching it.  Denies fever.  She has pain under her left arm for about a week.  States it feels like someone is pressing up in her under arm.  Rates the pain a 4/10.  Denies any radiating pain.  Denies having this before.  She hasn't taken any medication to help with the symptoms.     Relevant past medical, surgical, family and social history reviewed and updated as indicated. Interim medical history since our last visit reviewed. Allergies and medications reviewed and updated.  Review of Systems  Constitutional:  Negative for fever.  Musculoskeletal:        Pain in left axillary area  Skin:        Ingrown hair right axillary area    Per HPI unless specifically indicated above     Objective:    BP (!) 143/82 (BP Location: Left Arm, Patient Position: Sitting, Cuff Size: Large)   Pulse 66   Temp 98.5 F (36.9 C) (Oral)   Resp 17   Ht 5' 2.99 (1.6 m)   Wt 213 lb 12.8 oz (97 kg)   LMP 06/25/2023 (Approximate)   SpO2 97%   BMI 37.88 kg/m   Wt Readings from Last 3 Encounters:  06/21/24 213 lb 12.8 oz (97 kg)  05/16/24 214 lb 6.4 oz (97.3 kg)  11/03/23 212 lb (96.2 kg)    Physical Exam Vitals and nursing note reviewed.  Constitutional:      General: She is not in acute  distress.    Appearance: Normal appearance. She is normal weight. She is not ill-appearing, toxic-appearing or diaphoretic.  HENT:     Head: Normocephalic.     Right Ear: External ear normal.     Left Ear: External ear normal.     Nose: Nose normal.     Mouth/Throat:     Mouth: Mucous membranes are moist.     Pharynx: Oropharynx is clear.  Eyes:     General:        Right eye: No discharge.        Left eye: No discharge.     Extraocular Movements: Extraocular movements intact.     Conjunctiva/sclera: Conjunctivae normal.     Pupils: Pupils are equal, round, and reactive to light.  Cardiovascular:     Rate and Rhythm: Normal rate and regular rhythm.     Heart sounds: No murmur heard. Pulmonary:     Effort: Pulmonary effort is normal. No respiratory distress.     Breath sounds: Normal breath sounds. No wheezing or rales.  Chest:  Musculoskeletal:     Cervical back: Normal range of motion and neck supple.  Skin:    General: Skin is warm and dry.     Capillary Refill: Capillary refill takes less than 2 seconds.  Neurological:     General: No focal deficit present.     Mental Status: She is alert and oriented to person, place, and time. Mental status is at baseline.  Psychiatric:        Mood and Affect: Mood normal.        Behavior: Behavior normal.        Thought Content: Thought content normal.        Judgment: Judgment normal.     Results for orders placed or performed in visit on 05/16/24  Microalbumin, Urine Waived   Collection Time: 05/16/24  3:42 PM  Result Value Ref Range   Microalb, Ur Waived 30 (H) 0 - 19 mg/L   Creatinine, Urine Waived 100 10 - 300 mg/dL   Microalb/Creat Ratio <30 <30 mg/g  Comprehensive metabolic panel with GFR   Collection Time: 05/16/24  3:43 PM  Result Value Ref Range   Glucose 115 (H) 70 - 99 mg/dL   BUN 10 6 - 24 mg/dL   Creatinine, Ser 9.11 0.57 - 1.00 mg/dL   eGFR 78 >40 fO/fpw/8.26   BUN/Creatinine Ratio 11 9 - 23   Sodium 140  134 - 144 mmol/L   Potassium 4.3 3.5 - 5.2 mmol/L   Chloride 101 96 - 106 mmol/L   CO2 24 20 - 29 mmol/L   Calcium  9.9 8.7 - 10.2 mg/dL   Total Protein 7.5 6.0 - 8.5 g/dL   Albumin 4.8 3.8 - 4.9 g/dL   Globulin, Total 2.7 1.5 - 4.5 g/dL   Bilirubin Total 0.3 0.0 - 1.2 mg/dL   Alkaline Phosphatase 116 49 - 135 IU/L   AST 27 0 - 40 IU/L   ALT 46 (H) 0 - 32 IU/L  Hemoglobin A1c   Collection Time: 05/16/24  3:43 PM  Result Value Ref Range   Hgb A1c MFr Bld 8.0 (H) 4.8 - 5.6 %   Est. average glucose Bld gHb Est-mCnc 183 mg/dL  Lipid panel   Collection Time: 05/16/24  3:43 PM  Result Value Ref Range   Cholesterol, Total 175 100 - 199 mg/dL   Triglycerides 60 0 - 149 mg/dL   HDL 66 >60 mg/dL   VLDL Cholesterol Cal 12 5 - 40 mg/dL   LDL Chol Calc (NIH) 97 0 - 99 mg/dL   Chol/HDL Ratio 2.7 0.0 - 4.4 ratio      Assessment & Plan:   Problem List Items Addressed This Visit   None Visit Diagnoses       Left axillary pain    -  Primary   Mammogram completed this morning. Will order ultrasound given pain with palpation. Will make recommendations based on results.   Relevant Orders   US  BREAST COMPLETE UNI LEFT INC AXILLA     Ingrown hair       Has resolved.  No residual concerns.        Follow up plan: No follow-ups on file.

## 2024-06-26 ENCOUNTER — Encounter: Payer: Self-pay | Admitting: Nurse Practitioner

## 2024-08-16 ENCOUNTER — Ambulatory Visit: Admitting: Nurse Practitioner
